# Patient Record
Sex: Female | Born: 1985 | Race: Black or African American | Hispanic: No | Marital: Single | Smoking: Former smoker
Health system: Southern US, Community
[De-identification: ages and names within clinical notes are randomized; demographics above are authoritative.]

## PROBLEM LIST (undated history)

## (undated) DIAGNOSIS — K8020 Calculus of gallbladder without cholecystitis without obstruction: Secondary | ICD-10-CM

## (undated) HISTORY — DX: Calculus of gallbladder without cholecystitis without obstruction: K80.20

## (undated) HISTORY — PX: DILATION AND CURETTAGE OF UTERUS: SHX78

---

## 2013-08-09 ENCOUNTER — Emergency Department: Payer: Self-pay | Admitting: Emergency Medicine

## 2013-08-10 LAB — BASIC METABOLIC PANEL
ANION GAP: 7 (ref 7–16)
BUN: 14 mg/dL (ref 7–18)
CALCIUM: 8.7 mg/dL (ref 8.5–10.1)
CHLORIDE: 106 mmol/L (ref 98–107)
CREATININE: 0.97 mg/dL (ref 0.60–1.30)
Co2: 22 mmol/L (ref 21–32)
EGFR (African American): >60
EGFR (Non-African Amer.): >60
Glucose: 85 mg/dL (ref 65–99)
OSMOLALITY: 270 (ref 275–301)
POTASSIUM: 3.8 mmol/L (ref 3.5–5.1)
SODIUM: 135 mmol/L — AB (ref 136–145)

## 2013-08-10 LAB — URINALYSIS, COMPLETE
Bilirubin,UR: NEGATIVE
Glucose,UR: NEGATIVE mg/dL (ref 0–75)
Ketone: NEGATIVE
Nitrite: NEGATIVE
Ph: 6 (ref 4.5–8.0)
Protein: 30
RBC,UR: 4397 /HPF (ref 0–5)
Specific Gravity: 1.021 (ref 1.003–1.030)
WBC UR: 45 /HPF (ref 0–5)

## 2013-08-10 LAB — CBC
HCT: 40.8 % (ref 35.0–47.0)
HGB: 13.3 g/dL (ref 12.0–16.0)
MCH: 26.6 pg (ref 26.0–34.0)
MCHC: 32.5 g/dL (ref 32.0–36.0)
MCV: 82 fL (ref 80–100)
Platelet: 220 10*3/uL (ref 150–440)
RBC: 4.99 10*6/uL (ref 3.80–5.20)
RDW: 14.9 % — AB (ref 11.5–14.5)
WBC: 12.5 10*3/uL — ABNORMAL HIGH (ref 3.6–11.0)

## 2013-08-10 LAB — HCG, QUANTITATIVE, PREGNANCY: Beta Hcg, Quant.: <1 m[IU]/mL — ABNORMAL LOW

## 2014-03-09 ENCOUNTER — Encounter: Payer: Self-pay | Admitting: *Deleted

## 2014-03-28 ENCOUNTER — Ambulatory Visit (INDEPENDENT_AMBULATORY_CARE_PROVIDER_SITE_OTHER): Payer: Medicaid Other | Admitting: General Surgery

## 2014-03-28 ENCOUNTER — Encounter: Payer: Self-pay | Admitting: General Surgery

## 2014-03-28 VITALS — BP 138/68 | HR 74 | Resp 12 | Ht 64.0 in | Wt 236.0 lb

## 2014-03-28 DIAGNOSIS — K219 Gastro-esophageal reflux disease without esophagitis: Secondary | ICD-10-CM

## 2014-03-28 MED ORDER — OMEPRAZOLE 40 MG PO CPDR: 40.0000 mg | DELAYED_RELEASE_CAPSULE | Freq: Every day | ORAL | Status: DC

## 2014-03-28 NOTE — Patient Instructions (Signed)
Gastroesophageal Reflux Disease, Adult Gastroesophageal reflux disease (GERD) happens when acid from your stomach flows up into the esophagus. When acid comes in contact with the esophagus, the acid causes soreness (inflammation) in the esophagus. Over time, GERD may create small holes (ulcers) in the lining of the esophagus. CAUSES   Increased body weight. This puts pressure on the stomach, making acid rise from the stomach into the esophagus.  Smoking. This increases acid production in the stomach.  Drinking alcohol. This causes decreased pressure in the lower esophageal sphincter (valve or ring of muscle between the esophagus and stomach), allowing acid from the stomach into the esophagus.  Late evening meals and a full stomach. This increases pressure and acid production in the stomach.  A malformed lower esophageal sphincter. Sometimes, no cause is found. SYMPTOMS   Burning pain in the lower part of the mid-chest behind the breastbone and in the mid-stomach area. This may occur twice a week or more often.  Trouble swallowing.  Sore throat.  Dry cough.  Asthma-like symptoms including chest tightness, shortness of breath, or wheezing. DIAGNOSIS  Your caregiver may be able to diagnose GERD based on your symptoms. In some cases, X-rays and other tests may be done to check for complications or to check the condition of your stomach and esophagus. TREATMENT  Your caregiver may recommend over-the-counter or prescription medicines to help decrease acid production. Ask your caregiver before starting or adding any new medicines.  HOME CARE INSTRUCTIONS   Change the factors that you can control. Ask your caregiver for guidance concerning weight loss, quitting smoking, and alcohol consumption.  Avoid foods and drinks that make your symptoms worse, such as:  Caffeine or alcoholic drinks.  Chocolate.  Peppermint or mint flavorings.  Garlic and onions.  Spicy foods.  Citrus fruits,  such as oranges, lemons, or limes.  Tomato-based foods such as sauce, chili, salsa, and pizza.  Fried and fatty foods.  Avoid lying down for the 3 hours prior to your bedtime or prior to taking a nap.  Eat small, frequent meals instead of large meals.  Wear loose-fitting clothing. Do not wear anything tight around your waist that causes pressure on your stomach.  Raise the head of your bed 6 to 8 inches with wood blocks to help you sleep. Extra pillows will not help.  Only take over-the-counter or prescription medicines for pain, discomfort, or fever as directed by your caregiver.  Do not take aspirin, ibuprofen, or other nonsteroidal anti-inflammatory drugs (NSAIDs). SEEK IMMEDIATE MEDICAL CARE IF:   You have pain in your arms, neck, jaw, teeth, or back.  Your pain increases or changes in intensity or duration.  You develop nausea, vomiting, or sweating (diaphoresis).  You develop shortness of breath, or you faint.  Your vomit is green, yellow, black, or looks like coffee grounds or blood.  Your stool is red, bloody, or black. These symptoms could be signs of other problems, such as heart disease, gastric bleeding, or esophageal bleeding. MAKE SURE YOU:   Understand these instructions.  Will watch your condition.  Will get help right away if you are not doing well or get worse. Document Released: 04/09/2005 Document Revised: 09/22/2011 Document Reviewed: 01/17/2011 ExitCare Patient Information 2015 [employer]. This information is not intended to replace advice given to you by your health care provider. Make sure you discuss any questions you have with your health care provider.  

## 2014-03-28 NOTE — Progress Notes (Signed)
Patient ID: Victoria Elliott, female   DOB: 1985-09-01, 28 y.o.   MRN: [id number]  Chief Complaint  Patient presents with  . Abdominal Pain    gall stones    HPI Victoria Elliott is a 28 y.o. female.  Here today for evaluation of gall stones. She had an ultrasound at [facility] office that showed gallstones. She started having reflux symptoms and that is why she ordered an ultrasound.  She complains of having burning in her throat and heartburn. It seemed to have started while she was pregnant last year, went away briefly after child birth in October 2014, but then it returned. She tried Rolaids but it did not help but she did experience significant although not complete improvement with the use of OTC Prilosec provided to her by her boyfriend's mother. It is most likely to occur in the evening hours, occasionally lasting through the night. It does not seem to be associated with any foods or activity. She is not awakened from sleep with symptoms. She does feel alcohol may make the symptoms worse.  Denies any abdominal pain. Bowels move daily and no bleeding noted.  The patient had an ultrasound completed in her primary care physician's office, but did not bring any images with her as she had been requested.  The patient reports prepregnancy weight of approximately 180.  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  HPI  Past Medical History  Diagnosis Date  . Gall stones     Past Surgical History  Procedure Laterality Date  . Cesarean section  04-22-13    No family history on file.  Social History History  Substance Use Topics  . Smoking status: Never Smoker   . Smokeless tobacco: Not on file  . Alcohol Use: Yes     Comment: 1-2 occasionally    No Known Allergies  Current Outpatient Prescriptions  Medication Sig Dispense Refill  . omeprazole (PRILOSEC) 40 MG capsule Take 1 capsule (40 mg total) by mouth daily.  60 capsule  2   No  current facility-administered medications for this visit.    Review of Systems Review of Systems  Constitutional: Negative.   Respiratory: Negative.   Cardiovascular: Negative.   Gastrointestinal: Positive for abdominal pain.    Blood pressure 138/68, pulse 74, resp. rate 12, height  (1.626 m), weight 236 lb (107.049 kg), last menstrual period 02/10/2014.  Physical Exam Physical Exam  Constitutional: She is oriented to person, place, and time. She appears well-developed and well-nourished.  Neck: Neck supple.  Cardiovascular: Normal rate, regular rhythm and normal heart sounds.   Pulmonary/Chest: Effort normal and breath sounds normal.  Abdominal: Soft. Normal appearance and bowel sounds are normal. There is no tenderness.  Lymphadenopathy:    She has no cervical adenopathy.  Neurological: She is alert and oriented to person, place, and time.  Skin: Skin is warm and dry.    Data Reviewed Laboratory studies dated 09/28/2013, within 6 months of delivery showed white blood cell count 11,900 with normal differential, hemoglobin 14.0, MCV of 80, creatinine of 0.9 with an estimated GFR 96. Normal electrolytes. Mild elevation of the alkaline phosphatase is 126 (39-117. Normal SGOT/SGPT.  PCP notes of 03/14/2014 reports an abdominal ultrasound positive for gallstones.  Assessment    Symptomatic gastroesophageal reflux aggravated by significant weight gain during/post pregnancy. No symptoms to suggest chronic cholecystitis.    Plan    The patient is young with a 50+ year life expectancy. It  is possible her gallstones may become symptomatic in the future, but I don't believe a cholecystectomy at this time lower relieve her present symptoms likely related to reflux.  The importance of weight loss to control gastroesophageal reflux has been reviewed. She's been asked to make use of a trial of Prilosec twice a day for 2 months and to report her progress.  Symptoms compatible with  biliary colic have been reviewed, and she was asked to call promptly for any of these develop.    Trial of Prilosec twice a day.  PCP: Evelene Croon Ref Franco Nones NP  Earline Mayotte 03/29/2014, 7:11 AM

## 2014-03-29 DIAGNOSIS — K219 Gastro-esophageal reflux disease without esophagitis: Secondary | ICD-10-CM | POA: Insufficient documentation

## 2014-04-04 ENCOUNTER — Telehealth: Payer: Self-pay | Admitting: *Deleted

## 2014-04-04 NOTE — Telephone Encounter (Signed)
I left message for pt to call our office to clarify her date of birth. Demographic in epic shows [birth year] and paper demographic sheet shows [birth year]

## 2014-04-04 NOTE — Telephone Encounter (Signed)
Patient called back to confirm her date of birth is [birth year]. Bonita Quin made aware.

## 2014-05-16 ENCOUNTER — Encounter: Payer: Self-pay | Admitting: General Surgery

## 2017-09-08 ENCOUNTER — Emergency Department: Payer: Managed Care, Other (non HMO)

## 2017-09-08 ENCOUNTER — Emergency Department
Admission: EM | Admit: 2017-09-08 | Discharge: 2017-09-08 | Disposition: A | Discharge status: Home / Self Care | Payer: Managed Care, Other (non HMO) | Attending: Emergency Medicine | Admitting: Emergency Medicine

## 2017-09-08 ENCOUNTER — Encounter: Payer: Self-pay | Admitting: Emergency Medicine

## 2017-09-08 DIAGNOSIS — O200 Threatened abortion: Secondary | ICD-10-CM | POA: Diagnosis not present

## 2017-09-08 DIAGNOSIS — R1084 Generalized abdominal pain: Secondary | ICD-10-CM | POA: Diagnosis not present

## 2017-09-08 DIAGNOSIS — Z3A01 Less than 8 weeks gestation of pregnancy: Secondary | ICD-10-CM | POA: Diagnosis not present

## 2017-09-08 DIAGNOSIS — O208 Other hemorrhage in early pregnancy: Secondary | ICD-10-CM

## 2017-09-08 DIAGNOSIS — O209 Hemorrhage in early pregnancy, unspecified: Secondary | ICD-10-CM | POA: Diagnosis present

## 2017-09-08 DIAGNOSIS — R109 Unspecified abdominal pain: Secondary | ICD-10-CM

## 2017-09-08 DIAGNOSIS — O26899 Other specified pregnancy related conditions, unspecified trimester: Secondary | ICD-10-CM

## 2017-09-08 LAB — COMPREHENSIVE METABOLIC PANEL
ALBUMIN: 3.6 g/dL (ref 3.5–5.0)
ALT: 15 U/L (ref 14–54)
AST: 18 U/L (ref 15–41)
Alkaline Phosphatase: 84 U/L (ref 38–126)
Anion gap: 7 (ref 5–15)
BILIRUBIN TOTAL: 0.3 mg/dL (ref 0.3–1.2)
BUN: 10 mg/dL (ref 6–20)
CO2: 24 mmol/L (ref 22–32)
Calcium: 8.4 mg/dL — ABNORMAL LOW (ref 8.9–10.3)
Chloride: 106 mmol/L (ref 101–111)
Creatinine, Ser: 0.68 mg/dL (ref 0.44–1.00)
GFR calc Af Amer: >60 mL/min (ref >60)
GFR calc non Af Amer: >60 mL/min (ref >60)
GLUCOSE: 97 mg/dL (ref 65–99)
POTASSIUM: 3.6 mmol/L (ref 3.5–5.1)
SODIUM: 137 mmol/L (ref 135–145)
TOTAL PROTEIN: 7.2 g/dL (ref 6.5–8.1)

## 2017-09-08 LAB — CBC WITH DIFFERENTIAL/PLATELET
BASOS ABS: 0 10*3/uL (ref 0–0.1)
BASOS PCT: 1 %
EOS ABS: 0.4 10*3/uL (ref 0–0.7)
Eosinophils Relative: 5 %
HCT: 39 % (ref 35.0–47.0)
HEMOGLOBIN: 12.6 g/dL (ref 12.0–16.0)
Lymphocytes Relative: 32 %
Lymphs Abs: 2.7 10*3/uL (ref 1.0–3.6)
MCH: 26.8 pg (ref 26.0–34.0)
MCHC: 32.2 g/dL (ref 32.0–36.0)
MCV: 83.1 fL (ref 80.0–100.0)
Monocytes Absolute: 0.7 10*3/uL (ref 0.2–0.9)
Monocytes Relative: 8 %
NEUTROS PCT: 56 %
Neutro Abs: 4.8 10*3/uL (ref 1.4–6.5)
Platelets: 227 10*3/uL (ref 150–440)
RBC: 4.69 MIL/uL (ref 3.80–5.20)
RDW: 13.9 % (ref 11.5–14.5)
WBC: 8.6 10*3/uL (ref 3.6–11.0)

## 2017-09-08 LAB — HCG, QUANTITATIVE, PREGNANCY: HCG, BETA CHAIN, QUANT, S: 3453 m[IU]/mL — AB (ref <5)

## 2017-09-08 LAB — ABO/RH: ABO/RH(D): O POS

## 2017-09-08 NOTE — ED Provider Notes (Signed)
[facility] Emergency Department Provider Note       Time seen: ----------------------------------------- 9:30 AM on 09/08/2017 -----------------------------------------   I have reviewed the triage vital signs and the nursing notes.  HISTORY   Chief Complaint Vaginal Bleeding    HPI Victoria Elliott is a 32 y.o. female with a history of gallstones and GERD who presents to the ED for vaginal bleeding.  Patient reports she has had vaginal bleeding for 1 week which is getting heavier.  Patient states she is around [redacted] weeks pregnant.  She is G4 P1 Ab2.  She is not having any other symptoms.  She was having abdominal cramping but this has resolved.  Past Medical History:  Diagnosis Date  . Gall stones     Patient Active Problem List   Diagnosis Date Noted  . Esophageal reflux 03/29/2014    Past Surgical History:  Procedure Laterality Date  . CESAREAN SECTION  04-22-13    Allergies Patient has no known allergies.  Social History Social History   Tobacco Use  . Smoking status: Never Smoker  . Smokeless tobacco: Never Used  Substance Use Topics  . Alcohol use: Yes    Comment: 1-2 occasionally  . Drug use: No    Review of Systems Constitutional: Negative for fever. Cardiovascular: Negative for chest pain. Respiratory: Negative for shortness of breath. Gastrointestinal: Negative for abdominal pain, vomiting and diarrhea. Genitourinary: Positive for vaginal bleeding Musculoskeletal: Negative for back pain. Skin: Negative for rash. Neurological: Negative for headaches, focal weakness or numbness.  All systems negative/normal/unremarkable except as stated in the HPI  ____________________________________________   PHYSICAL EXAM:  VITAL SIGNS: ED Triage Vitals  Enc Vitals Group     BP 09/08/17 0823 (!) 120/54     Pulse Rate 09/08/17 0823 (!) 56     Resp 09/08/17 0823 18     Temp 09/08/17 0823 98.3 F (36.8 C)     Temp Source 09/08/17  0823 Oral     SpO2 09/08/17 0823 100 %     Weight 09/08/17 0812 236 lb (107 kg)     Height --      Head Circumference --      Peak Flow --      Pain Score --      Pain Loc --      Pain Edu? --      Excl. in GC? --    Constitutional: Alert and oriented. Well appearing and in no distress. Eyes: Conjunctivae are normal. Normal extraocular movements. ENT   Head: Normocephalic and atraumatic.   Nose: No congestion/rhinnorhea.   Mouth/Throat: Mucous membranes are moist.   Neck: No stridor. Cardiovascular: Normal rate, regular rhythm. No murmurs, rubs, or gallops. Respiratory: Normal respiratory effort without tachypnea nor retractions. Breath sounds are clear and equal bilaterally. No wheezes/rales/rhonchi. Gastrointestinal: Soft and nontender. Normal bowel sounds Musculoskeletal: Nontender with normal range of motion in extremities. No lower extremity tenderness nor edema. Neurologic:  Normal speech and language. No gross focal neurologic deficits are appreciated.  Skin:  Skin is warm, dry and intact. No rash noted. Psychiatric: Mood and affect are normal. Speech and behavior are normal.  ____________________________________________  ED COURSE:  As part of my medical decision making, I reviewed the following data within the electronic MEDICAL RECORD NUMBER History obtained from family if available, nursing notes, old chart and ekg, as well as notes from prior ED visits. Patient presented for vaginal bleeding in early pregnancy, we will assess with labs and imaging  as indicated at this time.   Procedures ____________________________________________   LABS (pertinent positives/negatives)  Labs Reviewed  HCG, QUANTITATIVE, PREGNANCY - Abnormal; Notable for the following components:      Result Value   hCG, Beta Chain, Quant, S 3,453 (*)    All other components within normal limits  COMPREHENSIVE METABOLIC PANEL - Abnormal; Notable for the following components:   Calcium 8.4  (*)    All other components within normal limits  CBC WITH DIFFERENTIAL/PLATELET  URINALYSIS, COMPLETE (UACMP) WITH MICROSCOPIC  POC URINE PREG, ED  ABO/RH    RADIOLOGY  Pregnancy ultrasound IMPRESSION: Single intrauterine gestational sac, measuring 5 weeks 2 days by mean sac diameter, as above.  No yolk sac or fetal pole are visualized. Consider follow-up pelvic ultrasound in 14 days to confirm viability, as clinically warranted. ____________________________________________  DIFFERENTIAL DIAGNOSIS   Threatened miscarriage, ectopic, UTI, STI  FINAL ASSESSMENT AND PLAN  Threatened miscarriage   Plan: Patient had presented for vaginal bleeding in early pregnancy. Patient's labs did reveal an hCG of 3453.Marland Kitchen Patient's imaging revealed a pregnancy of 5 weeks and 2 days.  She has been encouraged to have follow-up ultrasound in 2 weeks.  At this point she has a threatened miscarriage but otherwise is cleared for outpatient follow-up.   Ulice Dash, MD   Note: This note was generated in part or whole with voice recognition software. Voice recognition is usually quite accurate but there are transcription errors that can and very often do occur. I apologize for any typographical errors that were not detected and corrected.     Emily Filbert, MD 09/08/17 1249

## 2017-09-08 NOTE — ED Notes (Signed)
This RN notified US Morrie Sheldon(Ashley) of the hCG reading. Pt is awaiting US at this time.

## 2017-09-08 NOTE — ED Notes (Signed)
ED Provider at bedside. 

## 2017-09-08 NOTE — ED Triage Notes (Signed)
Pt with vaginal bleeding for one week, getting heavier and states she is 8weeks preg.

## 2017-09-22 NOTE — H&P (Signed)
  Ms. Victoria Elliott is a 32 y.o. female [phone] here for Follow-up (Threatened Miscarriage)  Pt story:  Victoria Elliott started having vaginal spotting around February 19th, which was initially very light. On February 26th she started to have an increased about of bleeding, enough to wear a pad, so she went to the ED at [facility]. She was seen in the office for follow-up on February 27th, at which point her bleeding was still like. At that visit, we decided to monitor her bleeding and trend her beta hcg. Her beta hcg was rising, but not as expected in a normally developing pregnancy, so she came back today for a follow-up ultrasound. She has continued to have bleeding since her last visit, enough that she is changing pads multiple times a day.   Imaging: 09/08/17 ultrasound: -MSD 5.96mm 1954w2d -yolk sac not visualized -embryo not visualized -B/L OVs WNL  Labs: Blood type: O+ Beta quant:  -2/26: 3,453 -2/28: 4,047 -3/5: 6,213    Exam:   Constitutional:    Vitals:   09/22/17 1400  BP: 104/66  Pulse: 64   Body mass index is 38.79 kg/m.   General Appearance:    Well-developed, well-nourished, no acute distress, appears stated age  Lungs:     Respirations unlabored  Neuro: Psych:   Alert, oriented x3  Appropriate mood and insight, judgement intact   TVUS: -Ut anteverted -Single IUP seen: MSD=0.83cm (3776w3d); 852w5d by LMP -No YS or embryo seen -Cx long and closed -No FF seen in CDSs -B/L ovs appear wnl   Impression:   The encounter diagnosis was Missed abortion.   Plan:   Spontaneous miscarriage: Causes of spontaneous miscarriage discussed with patient, including prevalence, common causes, and the expectation that this event does not increase the chance that she will miscarry again in the future. Emotional support given.  Management options discussed, including: expectant, medical and surgical.  Patient has opted for surgical management.   Surgical management: We  reviewed the risks of surgery with the patient including but not limited to: bleeding which may require transfusion; infection which may require antibiotics; injury to uterus or surrounding organs; intrauterine scarring which may impair future fertility; need for additional procedures including laparotomy or laparoscopy; and other postoperative/anesthesia complications. Written informed consent was obtained. Consents signed today.   This is a scheduled same-day surgery, being scheduled for this Friday, March 15th.

## 2017-09-24 ENCOUNTER — Encounter: Payer: Self-pay | Admitting: *Deleted

## 2017-09-24 ENCOUNTER — Encounter
Admission: RE | Admit: 2017-09-24 | Discharge: 2017-09-24 | Disposition: A | Discharge status: Home / Self Care | Payer: Managed Care, Other (non HMO) | Source: Ambulatory Visit | Attending: Obstetrics and Gynecology | Admitting: Obstetrics and Gynecology

## 2017-09-24 ENCOUNTER — Other Ambulatory Visit: Payer: Self-pay

## 2017-09-24 NOTE — Patient Instructions (Signed)
Your procedure is scheduled on: 09-25-17  Report to Same Day Surgery 2nd floor medical mall [facility](Medical Mall Entrance-take elevator on left to 2nd floor.  Check in with surgery information desk.) @ 11 AM-PT NOTIFIED OF THIS VIA PHONE   Remember: Instructions that are not followed completely may result in serious medical risk, up to and including death, or upon the discretion of your surgeon and anesthesiologist your surgery may need to be rescheduled.    _x___ 1. Do not eat food after midnight the night before your procedure. You may drink clear liquids up to 2 hours before you are scheduled to arrive at the hospital for your procedure.  Do not drink clear liquids within 2 hours of your scheduled arrival to the hospital.  Clear liquids include  --Water or Apple juice without pulp  --Clear carbohydrate beverage such as ClearFast or Gatorade  --Black Coffee or Clear Tea (No milk, no creamers, do not add anything to the coffee or Tea Type 1 and type 2 diabetics should only drink water.  No gum chewing or hard candies.     __x__ 2. No Alcohol for 24 hours before or after surgery.   __x__3. No Smoking or e-cigarettes for 24 prior to surgery.  Do not use any chewable tobacco products for at least 6 hour prior to surgery   ____  4. Bring all medications with you on the day of surgery if instructed.    __x__ 5. Notify your doctor if there is any change in your medical condition     (cold, fever, infections).    x___6. On the morning of surgery brush your teeth with toothpaste and water.  You may rinse your mouth with mouth wash if you wish.  Do not swallow any toothpaste or mouthwash.   Do not wear jewelry, make-up, hairpins, clips or nail polish.  Do not wear lotions, powders, or perfumes. You may wear deodorant.  Do not shave 48 hours prior to surgery. Men may shave face and neck.  Do not bring valuables to the hospital.    [facility] is not responsible for any belongings or valuables.            Contacts, dentures or bridgework may not be worn into surgery.  Leave your suitcase in the car. After surgery it may be brought to your room.  For patients admitted to the hospital, discharge time is determined by your treatment team.  _  Patients discharged the day of surgery will not be allowed to drive home.  You will need someone to drive you home and stay with you the night of your procedure.     ____ Take anti-hypertensive listed below, cardiac, seizure, asthma, anti-reflux and psychiatric medicines. These include:  1. NONE  2.  3.  4.  5.  6.  ____Fleets enema or Magnesium Citrate as directed.   ____ Use CHG Soap or sage wipes as directed on instruction sheet   ____ Use inhalers on the day of surgery and bring to hospital day of surgery  ____ Stop Metformin and Janumet 2 days prior to surgery.    ____ Take 1/2 of usual insulin dose the night before surgery and none on the morning     surgery.   ____ Follow recommendations from Cardiologist, Pulmonologist or PCP regarding          stopping Aspirin, Coumadin, Plavix ,Eliquis, Effient, or Pradaxa, and Pletal.  X____Stop Anti-inflammatories such as Advil, Aleve, Ibuprofen, Motrin, Naproxen, Naprosyn, Goodies powders or  aspirin products NOW-OK to take Tylenol    ____ Stop supplements until after surgery.     ____ Bring C-Pap to the hospital.

## 2017-09-25 ENCOUNTER — Ambulatory Visit: Payer: Managed Care, Other (non HMO) | Admitting: Anesthesiology

## 2017-09-25 ENCOUNTER — Ambulatory Visit
Admission: RE | Admit: 2017-09-25 | Discharge: 2017-09-25 | Disposition: A | Discharge status: Home / Self Care | Payer: Managed Care, Other (non HMO) | Source: Ambulatory Visit | Attending: Obstetrics and Gynecology | Admitting: Obstetrics and Gynecology

## 2017-09-25 ENCOUNTER — Encounter: Payer: Self-pay | Admitting: *Deleted

## 2017-09-25 ENCOUNTER — Encounter
Admission: RE | Disposition: A | Discharge status: Home / Self Care | Payer: Self-pay | Source: Ambulatory Visit | Attending: Obstetrics and Gynecology

## 2017-09-25 DIAGNOSIS — F17200 Nicotine dependence, unspecified, uncomplicated: Secondary | ICD-10-CM | POA: Diagnosis not present

## 2017-09-25 DIAGNOSIS — O021 Missed abortion: Secondary | ICD-10-CM | POA: Diagnosis present

## 2017-09-25 DIAGNOSIS — K219 Gastro-esophageal reflux disease without esophagitis: Secondary | ICD-10-CM | POA: Insufficient documentation

## 2017-09-25 HISTORY — PX: DILATION AND EVACUATION: SHX1459

## 2017-09-25 LAB — CBC
HCT: 38.8 % (ref 35.0–47.0)
Hemoglobin: 12.7 g/dL (ref 12.0–16.0)
MCH: 27 pg (ref 26.0–34.0)
MCHC: 32.8 g/dL (ref 32.0–36.0)
MCV: 82.4 fL (ref 80.0–100.0)
PLATELETS: 206 10*3/uL (ref 150–440)
RBC: 4.71 MIL/uL (ref 3.80–5.20)
RDW: 13.7 % (ref 11.5–14.5)
WBC: 9.8 10*3/uL (ref 3.6–11.0)

## 2017-09-25 LAB — BASIC METABOLIC PANEL
ANION GAP: 8 (ref 5–15)
BUN: 9 mg/dL (ref 6–20)
CO2: 25 mmol/L (ref 22–32)
Calcium: 8.7 mg/dL — ABNORMAL LOW (ref 8.9–10.3)
Chloride: 104 mmol/L (ref 101–111)
Creatinine, Ser: 0.82 mg/dL (ref 0.44–1.00)
GFR calc Af Amer: >60 mL/min (ref >60)
Glucose, Bld: 94 mg/dL (ref 65–99)
POTASSIUM: 3.8 mmol/L (ref 3.5–5.1)
SODIUM: 137 mmol/L (ref 135–145)

## 2017-09-25 LAB — TYPE AND SCREEN
ABO/RH(D): O POS
Antibody Screen: NEGATIVE

## 2017-09-25 SURGERY — DILATION AND EVACUATION, UTERUS
Anesthesia: General | Wound class: Clean Contaminated

## 2017-09-25 MED ORDER — GLYCOPYRROLATE 0.2 MG/ML IJ SOLN
INTRAMUSCULAR | Status: DC | PRN
  Administered 2017-09-25: 0.1 mg via INTRAVENOUS

## 2017-09-25 MED ORDER — PROPOFOL 10 MG/ML IV BOLUS
INTRAVENOUS | Status: DC | PRN
  Administered 2017-09-25: 200 mg via INTRAVENOUS

## 2017-09-25 MED ORDER — MIDAZOLAM HCL 2 MG/2ML IJ SOLN
INTRAMUSCULAR | Status: DC | PRN
  Administered 2017-09-25: 4 mg via INTRAVENOUS

## 2017-09-25 MED ORDER — HYDROMORPHONE HCL 1 MG/ML IJ SOLN
INTRAMUSCULAR | Status: AC
  Filled 2017-09-25: qty 1

## 2017-09-25 MED ORDER — DOCUSATE SODIUM 100 MG PO CAPS: 100.0000 mg | ORAL_CAPSULE | Freq: Two times a day (BID) | ORAL | 0 refills | Status: AC

## 2017-09-25 MED ORDER — LACTATED RINGERS IV SOLN: INTRAVENOUS | Status: DC

## 2017-09-25 MED ORDER — DEXAMETHASONE SODIUM PHOSPHATE 10 MG/ML IJ SOLN
INTRAMUSCULAR | Status: DC | PRN
  Administered 2017-09-25: 10 mg via INTRAVENOUS

## 2017-09-25 MED ORDER — OXYCODONE HCL 5 MG/5ML PO SOLN: 5.0000 mg | Freq: Once | ORAL | Status: DC | PRN

## 2017-09-25 MED ORDER — LIDOCAINE HCL (CARDIAC) 20 MG/ML IV SOLN
INTRAVENOUS | Status: DC | PRN
  Administered 2017-09-25: 80 mg via INTRAVENOUS

## 2017-09-25 MED ORDER — MIDAZOLAM HCL 2 MG/2ML IJ SOLN
INTRAMUSCULAR | Status: AC
  Filled 2017-09-25: qty 4

## 2017-09-25 MED ORDER — OXYCODONE HCL 5 MG PO TABS: 5.0000 mg | ORAL_TABLET | Freq: Once | ORAL | Status: DC | PRN

## 2017-09-25 MED ORDER — PROPOFOL 10 MG/ML IV BOLUS
INTRAVENOUS | Status: AC
  Filled 2017-09-25: qty 40

## 2017-09-25 MED ORDER — KETOROLAC TROMETHAMINE 30 MG/ML IJ SOLN
INTRAMUSCULAR | Status: DC | PRN
  Administered 2017-09-25: 30 mg via INTRAVENOUS

## 2017-09-25 MED ORDER — FAMOTIDINE 20 MG PO TABS
ORAL_TABLET | ORAL | Status: AC
  Filled 2017-09-25: qty 1

## 2017-09-25 MED ORDER — EPHEDRINE SULFATE 50 MG/ML IJ SOLN
INTRAMUSCULAR | Status: DC | PRN
  Administered 2017-09-25: 10 mg via INTRAVENOUS

## 2017-09-25 MED ORDER — HYDROMORPHONE HCL 1 MG/ML IJ SOLN
INTRAMUSCULAR | Status: DC | PRN
  Administered 2017-09-25 (×2): 1 mg via INTRAVENOUS

## 2017-09-25 MED ORDER — ACETAMINOPHEN 10 MG/ML IV SOLN
INTRAVENOUS | Status: DC | PRN
  Administered 2017-09-25: 1000 mg via INTRAVENOUS

## 2017-09-25 MED ORDER — ONDANSETRON HCL 4 MG/2ML IJ SOLN
INTRAMUSCULAR | Status: DC | PRN
  Administered 2017-09-25: 4 mg via INTRAVENOUS

## 2017-09-25 MED ORDER — FENTANYL CITRATE (PF) 100 MCG/2ML IJ SOLN: 25.0000 ug | INTRAMUSCULAR | Status: DC | PRN

## 2017-09-25 MED ORDER — ACETAMINOPHEN 10 MG/ML IV SOLN
INTRAVENOUS | Status: AC
  Filled 2017-09-25: qty 100

## 2017-09-25 MED ORDER — KETOROLAC TROMETHAMINE 30 MG/ML IJ SOLN
INTRAMUSCULAR | Status: AC
  Filled 2017-09-25: qty 1

## 2017-09-25 MED ORDER — LACTATED RINGERS IV SOLN
INTRAVENOUS | Status: DC
  Administered 2017-09-25 (×2): via INTRAVENOUS

## 2017-09-25 MED ORDER — ONDANSETRON 4 MG PO TBDP: 4.0000 mg | ORAL_TABLET | Freq: Four times a day (QID) | ORAL | 0 refills | Status: DC | PRN

## 2017-09-25 MED ORDER — FAMOTIDINE 20 MG PO TABS
20.0000 mg | ORAL_TABLET | Freq: Once | ORAL | Status: AC
  Administered 2017-09-25: 20 mg via ORAL

## 2017-09-25 MED ORDER — IBUPROFEN 800 MG PO TABS: 800.0000 mg | ORAL_TABLET | Freq: Three times a day (TID) | ORAL | 1 refills | Status: DC | PRN

## 2017-09-25 SURGICAL SUPPLY — 20 items
CATH ROBINSON RED A/P 16FR (CATHETERS) ×3 IMPLANT
FILTER UTR ASPR SPEC (MISCELLANEOUS) ×1 IMPLANT
FLTR UTR ASPR SPEC (MISCELLANEOUS) ×3
GLOVE BIO SURGEON STRL SZ7 (GLOVE) ×3 IMPLANT
GLOVE INDICATOR 7.5 STRL GRN (GLOVE) ×3 IMPLANT
GOWN STRL REUS W/ TWL LRG LVL3 (GOWN DISPOSABLE) ×2 IMPLANT
GOWN STRL REUS W/TWL LRG LVL3 (GOWN DISPOSABLE) ×4
KIT BERKELEY 1ST TRIMESTER 3/8 (MISCELLANEOUS) ×3 IMPLANT
KIT TURNOVER CYSTO (KITS) ×3 IMPLANT
PACK DNC HYST (MISCELLANEOUS) ×3 IMPLANT
PAD OB MATERNITY 4.3X12.25 (PERSONAL CARE ITEMS) ×3 IMPLANT
PAD PREP 24X41 OB/GYN DISP (PERSONAL CARE ITEMS) ×3 IMPLANT
SET BERKELEY SUCTION TUBING (SUCTIONS) ×3 IMPLANT
TOWEL OR 17X26 4PK STRL BLUE (TOWEL DISPOSABLE) ×3 IMPLANT
VACURETTE 10 RIGID CVD (CANNULA) IMPLANT
VACURETTE 6 ASPIR F TIP BERK (CANNULA) IMPLANT
VACURETTE 7MM F TIP (CANNULA)
VACURETTE 7MM F TIP STRL (CANNULA) IMPLANT
VACURETTE 8 RIGID CVD (CANNULA) ×3 IMPLANT
VACURETTE 8MM F TIP (MISCELLANEOUS) ×3 IMPLANT

## 2017-09-25 NOTE — Op Note (Signed)
Operative Report Suction Dilation and Curettage   Indications: Spontaneous abortion   Pre-operative Diagnosis: Missed abortion at 6 weeks by growth, 9 weeks by LMP  Post-operative Diagnosis: same.  Procedure: 1. Suction D&C  Surgeon: Christeen DouglasBethany Lenzie Sandler, MD  Assistant(s):  None  Anesthesia: General LMA anesthesia  Anesthesiologist: Piscitello, Cleda MccreedyJoseph K, MD Anesthesiologist: Piscitello, Cleda MccreedyJoseph K, MD CRNA: Sherol DadeMacMang, Josephine H, CRNA  Estimated Blood Loss:  less than 100 mL         Intraoperative medications: toradol, acetominophen         Total IV Fluids: 1100ml  Urine Output: 75ml         Specimens: Products of conception         Complications:  None; patient tolerated the procedure well.         Disposition: PACU - hemodynamically stable.         Condition: stable  Findings: Uterus measuring 6 weeks; normal cervix, vagina, perineum.   Indication for procedure/Consents: 32 y.o. G3P1011  here for scheduled surgery for the aforementioned diagnoses.  Risks of surgery were discussed with the patient including but not limited to: bleeding which may require transfusion; infection which may require antibiotics; injury to uterus or surrounding organs; intrauterine scarring which may impair future fertility; need for additional procedures including laparotomy or laparoscopy; and other postoperative/anesthesia complications. Written informed consent was obtained.    Procedure Details:   The patient received oral antibiotics while in the preoperative area.  She was then taken to the operating room where general anesthesia was administered and was found to be adequate.  After a formal and adequate timeout was performed, she was placed in the dorsal lithotomy position and examined with the above findings. She was then prepped and draped in the sterile manner.   Her bladder was catheterized for an estimated amount of clear, yellow urine. A speculum was then placed in the patient's vagina and  a single tooth tenaculum was applied to the anterior lip of the cervix.    No uterine sounding was performed on this pregnant uterus. Her cervix was serially dilated to accommodate a 8 sized flexible suction curette.  A sharp curettage was then performed until there was a gritty texture in all four quadrants.  The tenaculum was removed from the anterior lip of the cervix and the vaginal speculum was removed after noting good hemostasis. The patient tolerated the procedure well and was taken to the recovery area awake, extubated and in stable condition.  The patient will be discharged to home as per PACU criteria.  She will receive another dose of oral antibiotics prior to discharge. Routine postoperative instructions given.  She was prescribed Percocet, Ibuprofen and Colace.  She will follow up in the clinic in two weeks for postoperative evaluation.

## 2017-09-25 NOTE — Discharge Instructions (Signed)
Dilation and Curettage or Vacuum Curettage, Care After These instructions give you information about caring for yourself after your procedure. Your doctor may also give you more specific instructions. Call your doctor if you have any problems or questions after your procedure. Follow these instructions at home: Activity    For 24 hours after your procedure, avoid driving.  Take short walks often, followed by rest periods. Ask your doctor what activities are safe for you. After one or two days, you may be able to return to your normal activities.  Do not lift anything that is heavier than 10 lb (4.5 kg) until your doctor approves.  For at least 2 weeks, or as long as told by your doctor: ? Do not douche. ? Do not use tampons. ? Do not have sex. General instructions  Take over-the-counter and prescription medicines only as told by your doctor. This is very important if you take blood thinning medicine.  Do not take baths, swim, or use a hot tub until your doctor approves. Take showers instead of baths.  Wear compression stockings as told by your doctor.  It is up to you to get the results of your procedure. Ask your doctor when your results will be ready.  Keep all follow-up visits as told by your doctor. This is important. Contact a doctor if:  You have very bad cramps that get worse or do not get better with medicine.  You have very bad pain in your belly (abdomen).  You cannot drink fluids without throwing up (vomiting).  You get pain in a different part of the area between your belly and thighs (pelvis).  You have bad-smelling discharge from your vagina.  You have a rash. Get help right away if:  You are bleeding a lot from your vagina. A lot of bleeding means soaking more than one sanitary pad in an hour, for 2 hours in a row.  You have clumps of blood (blood clots) coming from your vagina.  You have a fever or chills.  Your belly feels very tender or hard.  You  have chest pain.  You have trouble breathing.  You cough up blood.  You feel dizzy.  You feel light-headed.  You pass out (faint).  You have pain in your neck or shoulder area. Summary  Take short walks often, followed by rest periods. Ask your doctor what activities are safe for you. After one or two days, you may be able to return to your normal activities.  Do not lift anything that is heavier than 10 lb (4.5 kg) until your doctor approves.  Do not take baths, swim, or use a hot tub until your doctor approves. Take showers instead of baths.  Contact your doctor if you have any symptoms of infection, like bad-smelling discharge from your vagina. This information is not intended to replace advice given to you by your health care provider. Make sure you discuss any questions you have with your health care provider. Document Released: 04/08/2008 Document Revised: 03/17/2016 Document Reviewed: 03/17/2016 Elsevier Interactive Patient Education  2017 [employer]

## 2017-09-25 NOTE — Transfer of Care (Signed)
Immediate Anesthesia Transfer of Care Note  Patient: Victoria Elliott  Procedure(s) Performed: DILATATION AND EVACUATION (N/A )  Patient Location: PACU  Anesthesia Type:General  Level of Consciousness: awake, alert , oriented and patient cooperative  Airway & Oxygen Therapy: Patient Spontanous Breathing and Patient connected to face mask oxygen  Post-op Assessment: Report given to RN, Post -op Vital signs reviewed and stable and Patient moving all extremities X 4  Post vital signs: Reviewed and stable  Last Vitals:  Vitals:   09/25/17 1110  BP: 126/67  Pulse: 64  Resp: 20  Temp: 36.4 C  SpO2: 100%    Last Pain:  Vitals:   09/25/17 1110  TempSrc: Oral  PainSc: 2          Complications: No apparent anesthesia complications

## 2017-09-25 NOTE — Anesthesia Postprocedure Evaluation (Signed)
Anesthesia Post Note  Patient: Sport and exercise psychologisthamika Elliott  Procedure(s) Performed: DILATATION AND EVACUATION (N/A )  Patient location during evaluation: PACU Anesthesia Type: General Level of consciousness: awake and alert Pain management: pain level controlled Vital Signs Assessment: post-procedure vital signs reviewed and stable Respiratory status: spontaneous breathing, nonlabored ventilation, respiratory function stable and patient connected to nasal cannula oxygen Cardiovascular status: blood pressure returned to baseline and stable Postop Assessment: no apparent nausea or vomiting Anesthetic complications: no     Last Vitals:  Vitals:   09/25/17 1422 09/25/17 1450  BP: (!) 109/41 (!) 109/57  Pulse: (!) 50 (!) 50  Resp: 16   Temp: (!) 36.3 C   SpO2: 100% 100%    Last Pain:  Vitals:   09/25/17 1450  TempSrc:   PainSc: 0-No pain                 Cleda MccreedyJoseph K Altovise Wahler

## 2017-09-25 NOTE — Anesthesia Preprocedure Evaluation (Signed)
Anesthesia Evaluation  Patient identified by MRN, date of birth, ID band Patient awake    Reviewed: Allergy & Precautions, H&P , NPO status , Patient's Chart, lab work & pertinent test results  Airway Mallampati: III  TM Distance: >3 FB Neck ROM: full    Dental  (+) Chipped, Poor Dentition   Pulmonary neg shortness of breath, Current Smoker,           Cardiovascular Exercise Tolerance: Good (-) angina(-) DOE negative cardio ROS  (-) Valvular Problems/Murmurs     Neuro/Psych negative neurological ROS  negative psych ROS   GI/Hepatic Neg liver ROS, GERD  Medicated and Controlled,  Endo/Other  negative endocrine ROS  Renal/GU      Musculoskeletal   Abdominal   Peds  Hematology negative hematology ROS (+)   Anesthesia Other Findings Past Medical History: No date: Gall stones  Past Surgical History: 04-22-13: CESAREAN SECTION     Reproductive/Obstetrics negative OB ROS                             Anesthesia Physical Anesthesia Plan  ASA: II  Anesthesia Plan: General   Post-op Pain Management:    Induction: Intravenous  PONV Risk Score and Plan: 2 and Ondansetron, Dexamethasone and Midazolam  Airway Management Planned: LMA  Additional Equipment:   Intra-op Plan:   Post-operative Plan: Extubation in OR  Informed Consent: I have reviewed the patients History and Physical, chart, labs and discussed the procedure including the risks, benefits and alternatives for the proposed anesthesia with the patient or authorized representative who has indicated his/her understanding and acceptance.   Dental Advisory Given  Plan Discussed with: Anesthesiologist, CRNA and Surgeon  Anesthesia Plan Comments: (Patient consented for risks of anesthesia including but not limited to:  - adverse reactions to medications - damage to teeth, lips or other oral mucosa - sore throat or  hoarseness - Damage to heart, brain, lungs or loss of life  Patient voiced understanding.)        Anesthesia Quick Evaluation

## 2017-09-25 NOTE — Anesthesia Procedure Notes (Signed)
Procedure Name: LMA Insertion Date/Time: 09/25/2017 12:34 PM Performed by: Sherol DadeMacMang, Mashelle Busick H, CRNA Pre-anesthesia Checklist: Patient identified, Emergency Drugs available, Suction available, Patient being monitored and Timeout performed Patient Re-evaluated:Patient Re-evaluated prior to induction Oxygen Delivery Method: Circle system utilized Preoxygenation: Pre-oxygenation with 100% oxygen Induction Type: IV induction Ventilation: Mask ventilation without difficulty LMA: LMA inserted LMA Size: 4.0 Tube type: Oral Number of attempts: 1 Placement Confirmation: positive ETCO2,  CO2 detector and breath sounds checked- equal and bilateral Tube secured with: Tape Dental Injury: Teeth and Oropharynx as per pre-operative assessment

## 2017-09-25 NOTE — Anesthesia Post-op Follow-up Note (Signed)
Anesthesia QCDR form completed.        

## 2017-09-25 NOTE — Interval H&P Note (Signed)
History and Physical Interval Note:  09/25/2017 12:11 PM  Victoria Elliott  has presented today for surgery, with the diagnosis of incomplete spontaneous abortion  The various methods of treatment have been discussed with the patient and family. After consideration of risks, benefits and other options for treatment, the patient has consented to  Procedure(s): DILATATION AND EVACUATION (N/A) as a surgical intervention .  The patient's history has been reviewed, patient examined, no change in status, stable for surgery.  I have reviewed the patient's chart and labs.  Questions were answered to the patient's satisfaction.     Christeen DouglasBethany Granger Chui

## 2017-09-28 LAB — SURGICAL PATHOLOGY

## 2018-09-29 ENCOUNTER — Other Ambulatory Visit: Payer: Self-pay

## 2018-09-29 ENCOUNTER — Emergency Department
Admission: EM | Admit: 2018-09-29 | Discharge: 2018-09-29 | Disposition: A | Discharge status: Home / Self Care | Payer: Managed Care, Other (non HMO) | Attending: Emergency Medicine | Admitting: Emergency Medicine

## 2018-09-29 ENCOUNTER — Encounter: Payer: Self-pay | Admitting: Emergency Medicine

## 2018-09-29 DIAGNOSIS — Z7689 Persons encountering health services in other specified circumstances: Secondary | ICD-10-CM | POA: Insufficient documentation

## 2018-09-29 NOTE — ED Triage Notes (Signed)
PT arrives from work stating her work sent her home for 14 days or until she received a work note clearing her to go back to work. Pt states her work checks their temperature prior to clocking in and hers was 100.2. In triage pt's oral temp 98.4. No travel reported; no known exposure. Pt denies symptoms.

## 2018-09-29 NOTE — ED Provider Notes (Signed)
[facility] Emergency Department Provider Note ____________________________________________  Time seen: 1407  I have reviewed the triage vital signs and the nursing notes.  HISTORY  Chief Complaint  Work Note  HPI Victoria Elliott is a 33 y.o. female presents herself to the ED from home, for request of evaluation and work note.  Patient works at a local [employer], where they have been doing daily temp checks of the employees.  Patient describes she was found to have an oral temperature of 100.2 F upon reporting to work for her second shift job.  Patient reports her last oral intake was about 4 hours prior to arrival.  She has not had any coffee, tea, or anything to eat or drink within an hour or for temperature check.  According to the patient, her company has adopted a policy suggesting the any patient with a temperature >99.5 F.  The patient denies any cough, fever, travel (to a high risk area), contact with anyone who has traveled, or anyone was tested positive for COIVD-19.  She is here at employer's request for screening and return to work evaluation.  Past Medical History:  Diagnosis Date  . Gall stones     Patient Active Problem List   Diagnosis Date Noted  . Esophageal reflux 03/29/2014    Past Surgical History:  Procedure Laterality Date  . CESAREAN SECTION  04-22-13  . DILATION AND EVACUATION N/A 09/25/2017   Procedure: DILATATION AND EVACUATION;  Surgeon: Christeen Douglas, MD;  Location: [facility] ORS;  Service: Gynecology;  Laterality: N/A;    Prior to Admission medications   Medication Sig Start Date End Date Taking? Authorizing Provider  ibuprofen (ADVIL,MOTRIN) 800 MG tablet Take 1 tablet (800 mg total) by mouth every 8 (eight) hours as needed for moderate pain or cramping. 09/25/17   Christeen Douglas, MD  ondansetron (ZOFRAN ODT) 4 MG disintegrating tablet Take 1 tablet (4 mg total) by mouth every 6 (six) hours as needed for nausea.  09/25/17   Christeen Douglas, MD    Allergies Patient has no known allergies.  History reviewed. No pertinent family history.  Social History Social History   Tobacco Use  . Smoking status: Current Some Day Smoker    Packs/day: 0.50    Years: 10.00    Pack years: 5.00    Types: Cigarettes  . Smokeless tobacco: Never Used  Substance Use Topics  . Alcohol use: Yes    Comment: 1-2 occasionally  . Drug use: No    Review of Systems  Constitutional: Negative for fever. Eyes: Negative for visual changes. ENT: Negative for sore throat. Cardiovascular: Negative for chest pain. Respiratory: Negative for shortness of breath. Gastrointestinal: Negative for abdominal pain, vomiting and diarrhea. Genitourinary: Negative for dysuria. Musculoskeletal: Negative for back pain. Skin: Negative for rash. Neurological: Negative for headaches, focal weakness or numbness. ____________________________________________  PHYSICAL EXAM:  VITAL SIGNS: ED Triage Vitals [09/29/18 1317]  Enc Vitals Group     BP (!) 121/91     Pulse Rate 67     Resp 16     Temp 98.4 F (36.9 C)     Temp Source Oral     SpO2 100 %     Weight 220 lb (99.8 kg)     Height 5\' 4"  (1.626 m)     Head Circumference      Peak Flow      Pain Score 0     Pain Loc      Pain Edu?  Excl. in GC?     Constitutional: Alert and oriented. Well appearing and in no distress. Head: Normocephalic and atraumatic. Eyes: Conjunctivae are normal. PERRL. Normal extraocular movements Ears: Canals clear. TMs intact bilaterally. Nose: No congestion/rhinorrhea/epistaxis. Mouth/Throat: Mucous membranes are moist. Neck: Supple. No thyromegaly. Hematological/Lymphatic/Immunological: No cervical lymphadenopathy. Cardiovascular: Normal rate, regular rhythm. Normal distal pulses. Respiratory: Normal respiratory effort. No wheezes/rales/rhonchi. Musculoskeletal: Nontender with normal range of motion in all extremities.  Neurologic:   Normal gait without ataxia. Normal speech and language. No gross focal neurologic deficits are appreciated. Skin:  Skin is warm, dry and intact. No rash noted. Psychiatric: Mood and affect are normal. Patient exhibits appropriate insight and judgment. ____________________________________________  PROCEDURES  Procedures ____________________________________________  INITIAL IMPRESSION / ASSESSMENT AND PLAN / ED COURSE  Patient with a benign exam, afebrile, and without any high risk criteria for testing, presents at her employer's request.  We discussed that without criteria for testing, the recommendation is a 2-week quarantine with daily temp checks.  Patient apparently has family in the interim that that is also her company's current policy.  Patient is given instruction on monitoring of her symptoms, and possible screening should symptoms develop.  She is discharged with a work note releasing her to work tomorrow if there is no requirement for quarantine versus a return to work on 10-14-18, if a two-week quarantine is in fact implemented.  Patient verbalized understanding is discharged to follow with primary provider as discussed. ____________________________________________  FINAL CLINICAL IMPRESSION(S) / ED DIAGNOSES  Final diagnoses:  Return to work evaluation      Karmen Stabs, Charlesetta Ivory, PA-C 09/29/18 1527    Don Perking, [place], MD 10/03/18 1134

## 2018-09-29 NOTE — ED Notes (Signed)
Pt states is employee at [employer], states at work today had oral temp of 100.2, was sent home for 2 weeks or until medically cleared to come back to work. Pt denies symptoms at this time, is afebrile on arrival to ED. Pt is alert and oriented, NAD noted at this time.

## 2018-09-29 NOTE — Discharge Instructions (Addendum)
Your exam is normal, you are without fevers or symptoms on presentation. You DO NOT meet criteria for high-risk exposure to, or contact with a high-risk individual. These health system guidelines allow [place] to triage and manage anyone who presents with concern of exposure. 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 We suggest twice daily temperature checks. Continue to monitor for any symptoms. Call your primary care provider for screening instruction. Return to the ED only if (severe) respiratory symptoms and/or fevers develop.

## 2019-05-11 ENCOUNTER — Emergency Department
Admission: EM | Admit: 2019-05-11 | Discharge: 2019-05-11 | Disposition: A | Discharge status: Home / Self Care | Payer: Medicaid Other | Attending: Emergency Medicine | Admitting: Emergency Medicine

## 2019-05-11 ENCOUNTER — Other Ambulatory Visit: Payer: Self-pay

## 2019-05-11 ENCOUNTER — Emergency Department: Payer: Medicaid Other

## 2019-05-11 DIAGNOSIS — Z20828 Contact with and (suspected) exposure to other viral communicable diseases: Secondary | ICD-10-CM | POA: Diagnosis not present

## 2019-05-11 DIAGNOSIS — R05 Cough: Secondary | ICD-10-CM | POA: Diagnosis present

## 2019-05-11 DIAGNOSIS — J069 Acute upper respiratory infection, unspecified: Secondary | ICD-10-CM | POA: Diagnosis not present

## 2019-05-11 MED ORDER — ONDANSETRON 4 MG PO TBDP: 4.0000 mg | ORAL_TABLET | Freq: Three times a day (TID) | ORAL | 0 refills | Status: DC | PRN

## 2019-05-11 MED ORDER — PREDNISONE 10 MG PO TABS: ORAL_TABLET | ORAL | 0 refills | Status: DC

## 2019-05-11 MED ORDER — ONDANSETRON 4 MG PO TBDP
4.0000 mg | ORAL_TABLET | Freq: Once | ORAL | Status: AC
  Administered 2019-05-11: 4 mg via ORAL
  Filled 2019-05-11: qty 1

## 2019-05-11 NOTE — Discharge Instructions (Signed)
Follow-up with your primary care provider or return to the emergency department if any severe worsening of your symptoms or difficulty breathing.  A prescription for both Zofran and prednisone was sent to your pharmacy.  Increase fluids.  You may also take Tylenol with the prednisone if additional pain medication is needed or your fever is elevated.  Discontinue smoking.  You will need to quarantine until you have heard the results of your Covid test.

## 2019-05-11 NOTE — ED Provider Notes (Signed)
[facility] Emergency Department Provider Note  ___________________________________________   First MD Initiated Contact with Patient 05/11/19 1326     (approximate)  I have reviewed the triage vital signs and the nursing notes.   HISTORY  Chief Complaint Sore Throat and Cough   HPI Victoria Elliott is a 33 y.o. female presents to the ED with complaint of sore throat, cough, decreased appetite and subjective fever and chills.  He also reports nausea and diarrhea.  Patient states that she recently traveled to [place].  She is unaware of any specific positive contacts to Covid.  Patient states that her symptoms were sudden.  She denies any other family members being sick.  Patient rates her pain as a 6 out of 10.       Past Medical History:  Diagnosis Date  . Gall stones     Patient Active Problem List   Diagnosis Date Noted  . Esophageal reflux 03/29/2014    Past Surgical History:  Procedure Laterality Date  . CESAREAN SECTION  04-22-13  . DILATION AND EVACUATION N/A 09/25/2017   Procedure: DILATATION AND EVACUATION;  Surgeon: Christeen Douglas, MD;  Location: [facility] ORS;  Service: Gynecology;  Laterality: N/A;    Prior to Admission medications   Medication Sig Start Date End Date Taking? Authorizing Provider  ondansetron (ZOFRAN ODT) 4 MG disintegrating tablet Take 1 tablet (4 mg total) by mouth every 8 (eight) hours as needed for nausea or vomiting. 05/11/19   Tommi Rumps, PA-C  predniSONE (DELTASONE) 10 MG tablet Take 6 tablets  today, on day 2 take 5 tablets, day 3 take 4 tablets, day 4 take 3 tablets, day 5 take  2 tablets and 1 tablet the last day 05/11/19   Tommi Rumps, PA-C    Allergies Patient has no known allergies.  No family history on file.  Social History Social History   Tobacco Use  . Smoking status: Current Some Day Smoker    Packs/day: 0.50    Years: 10.00    Pack years: 5.00    Types: Cigarettes  . Smokeless  tobacco: Never Used  Substance Use Topics  . Alcohol use: Yes    Comment: 1-2 occasionally  . Drug use: No    Review of Systems Constitutional: Objective fever/chills Eyes: No visual changes. ENT: Positive sore throat. Cardiovascular: Denies chest pain. Respiratory: Denies shortness of breath.  Positive cough. Gastrointestinal: No abdominal pain.  Positive nausea, no vomiting.  Positive diarrhea. Genitourinary: Negative for dysuria. Musculoskeletal: Positive for muscle aches. Skin: Negative for rash. Neurological: Negative for headaches, focal weakness or numbness. ____________________________________________   PHYSICAL EXAM:  VITAL SIGNS: ED Triage Vitals [05/11/19 1317]  Enc Vitals Group     BP 137/79     Pulse Rate 88     Resp 16     Temp 99.3 F (37.4 C)     Temp Source Oral     SpO2 100 %     Weight 222 lb (100.7 kg)     Height 5\' 4"  (1.626 m)     Head Circumference      Peak Flow      Pain Score 6     Pain Loc      Pain Edu?      Excl. in GC?    Constitutional: Alert and oriented. Well appearing and in no acute distress. Eyes: Conjunctivae are normal. PERRL. EOMI. Head: Atraumatic. Nose: No congestion/rhinnorhea. Mouth/Throat: Mucous membranes are moist.  Oropharynx non-erythematous.  Uvula is midline.  No exudate was noted. Neck: No stridor.   Cardiovascular: Normal rate, regular rhythm. Grossly normal heart sounds.  Good peripheral circulation. Respiratory: Normal respiratory effort.  No retractions. Lungs mild expiratory wheeze heard left upper lung.  Patient has a frequent cough. Gastrointestinal: Soft and nontender. No distention.  Musculoskeletal: Moves upper and lower extremities that any difficulty.  Normal gait was noted. Neurologic:  Normal speech and language. No gross focal neurologic deficits are appreciated. No gait instability. Skin:  Skin is warm, dry and intact. No rash noted. Psychiatric: Mood and affect are normal. Speech and behavior are  normal.  ____________________________________________   LABS (all labs ordered are listed, but only abnormal results are displayed)  Labs Reviewed  SARS CORONAVIRUS 2 (TAT 6-24 HRS)   RADIOLOGY   Official radiology report(s): Dg Chest Portable 1 View  Result Date: 05/11/2019 CLINICAL DATA:  Cough and fever for 2 days, recent travel EXAM: PORTABLE CHEST 1 VIEW COMPARISON:  None. FINDINGS: Normal heart size. Normal mediastinal contour. No pneumothorax. No pleural effusion. Lungs appear clear, with no acute consolidative airspace disease and no pulmonary edema. IMPRESSION: No active disease. Electronically Signed   By: Delbert PhenixJason A Poff M.D.   On: 05/11/2019 14:46    ____________________________________________   PROCEDURES  Procedure(s) performed (including Critical Care):  Procedures ____________________________________________   INITIAL IMPRESSION / ASSESSMENT AND PLAN / ED COURSE  As part of my medical decision making, I reviewed the following data within the electronic MEDICAL RECORD NUMBER Notes from prior ED visits and [place] Controlled Substance Database   Victoria Elliott was evaluated in Emergency Department on 05/11/2019 for the symptoms described in the history of present illness. She was evaluated in the context of the global COVID-19 pandemic, which necessitated consideration that the patient might be at risk for infection with the SARS-CoV-2 virus that causes COVID-19. Institutional protocols and algorithms that pertain to the evaluation of patients at risk for COVID-19 are in a state of rapid change based on information released by regulatory bodies including the CDC and federal and state organizations. These policies and algorithms were followed during the patient's care in the ED.  33 year old female presents to the ED with complaint of sore throat, cough, chills for 2 days after returning from a trip to [place].  Patient has continued to smoke during this time.  She denies any  other family member sick with similar symptoms.  She complains of some mild nausea and diarrhea.  Zofran was given while in the ED and patient was able to eat some crackers.  Chest x-ray was negative for acute cardiopulmonary disease.  A Covid test was done and patient is aware that she will receive the information in 1 to 2 days.  She is to quarantine until she has received the results.  She was given a note to remain out of work during that time and also an additional 10 days if she is positive.  She was encouraged to return to the emergency department if any severe worsening of her symptoms or difficulty breathing.  She was given a prescription for prednisone and also Zofran.  ____________________________________________   FINAL CLINICAL IMPRESSION(S) / ED DIAGNOSES  Final diagnoses:  Viral URI with cough     ED Discharge Orders         Ordered    predniSONE (DELTASONE) 10 MG tablet     05/11/19 1500    ondansetron (ZOFRAN ODT) 4 MG disintegrating tablet  Every 8 hours PRN  05/11/19 1500           Note:  This document was prepared using Dragon voice recognition software and may include unintentional dictation errors.    Johnn Hai, PA-C 05/11/19 1511    Carrie Mew, MD 05/13/19 2045

## 2019-05-11 NOTE — ED Notes (Signed)
esign not working at this time. Pt verbalized discharge instructions and has no questions at this time.  

## 2019-05-11 NOTE — ED Notes (Signed)
Pt given saltines ? ?

## 2019-05-11 NOTE — ED Triage Notes (Signed)
Says she has covid symptoms.

## 2019-05-11 NOTE — ED Triage Notes (Signed)
Sore throat and cough X 2 days, states she recently traveled for [place]. No known positive contacts. Pt alert and oriented X4, cooperative, RR even and unlabored, color WNL. Pt in NAD.

## 2019-05-12 LAB — SARS CORONAVIRUS 2 (TAT 6-24 HRS): SARS Coronavirus 2: NEGATIVE

## 2020-05-03 DIAGNOSIS — O09893 Supervision of other high risk pregnancies, third trimester: Secondary | ICD-10-CM

## 2020-08-29 ENCOUNTER — Other Ambulatory Visit: Payer: Self-pay

## 2020-08-29 ENCOUNTER — Observation Stay
Admission: EM | Admit: 2020-08-29 | Discharge: 2020-08-29 | Disposition: A | Discharge status: Home / Self Care | Payer: 59 | Attending: Certified Nurse Midwife | Admitting: Certified Nurse Midwife

## 2020-08-29 DIAGNOSIS — K2970 Gastritis, unspecified, without bleeding: Secondary | ICD-10-CM | POA: Diagnosis not present

## 2020-08-29 DIAGNOSIS — O99612 Diseases of the digestive system complicating pregnancy, second trimester: Secondary | ICD-10-CM | POA: Diagnosis present

## 2020-08-29 DIAGNOSIS — Z3A23 23 weeks gestation of pregnancy: Secondary | ICD-10-CM | POA: Diagnosis not present

## 2020-08-29 DIAGNOSIS — Z8616 Personal history of COVID-19: Secondary | ICD-10-CM | POA: Diagnosis not present

## 2020-08-29 MED ORDER — CALCIUM CARBONATE ANTACID 500 MG PO CHEW
400.0000 mg | CHEWABLE_TABLET | Freq: Once | ORAL | Status: AC
  Administered 2020-08-29: 400 mg via ORAL
  Filled 2020-08-29: qty 2

## 2020-08-29 MED ORDER — ONDANSETRON 4 MG PO TBDP
4.0000 mg | ORAL_TABLET | Freq: Once | ORAL | Status: AC
  Administered 2020-08-29: 4 mg via ORAL
  Filled 2020-08-29: qty 1

## 2020-08-29 MED ORDER — LACTATED RINGERS IV BOLUS
1000.0000 mL | Freq: Once | INTRAVENOUS | Status: AC
  Administered 2020-08-29: 1000 mL via INTRAVENOUS

## 2020-08-29 MED ORDER — FAMOTIDINE 20 MG PO TABS
40.0000 mg | ORAL_TABLET | Freq: Once | ORAL | Status: AC
  Administered 2020-08-29: 40 mg via ORAL
  Filled 2020-08-29: qty 2

## 2020-08-29 MED ORDER — ACETAMINOPHEN 325 MG PO TABS
650.0000 mg | ORAL_TABLET | Freq: Four times a day (QID) | ORAL | Status: DC | PRN
  Administered 2020-08-29: 650 mg via ORAL
  Filled 2020-08-29: qty 2

## 2020-08-29 MED ORDER — PANTOPRAZOLE SODIUM 40 MG PO TBEC: 40.0000 mg | DELAYED_RELEASE_TABLET | Freq: Every day | ORAL | 5 refills | Status: DC

## 2020-08-29 MED ORDER — LACTATED RINGERS IV SOLN: INTRAVENOUS | Status: DC

## 2020-08-29 MED ORDER — ONDANSETRON 4 MG PO TBDP: 4.0000 mg | ORAL_TABLET | Freq: Three times a day (TID) | ORAL | 0 refills | Status: DC | PRN

## 2020-08-29 NOTE — Progress Notes (Signed)
RN and CNM at bedside and reviewed discharge instructions, medications and follow up care with patient. RN reviewed BRAT diet with patient and when to return for treatment. Patient verbalized understanding and all questions answered.

## 2020-08-29 NOTE — Discharge Summary (Signed)
Patient ID: Claude Swendsen MRN: [id number] DOB/AGE: [birth year] 35 y.o.  Admit date: 08/29/2020 Discharge date: 08/29/2020  Admission Diagnoses: 34yo, G3P0, at [redacted]w[redacted]d with N/V - starting yesterday and Diarrhea - starting last night and mild HA.  Covid infection in Jan 2022,  vaccinated x3.  Factors complicating this pregnancy  1. Marginal cord insertion 2. Obesity in pregnancy - BMI 41 3. History of LTCS 4. Prediabetes  5. H/o mental health diagnoses  Discharge Diagnoses: Gastritis  Prenatal Procedures: none  Consults: None  Significant Diagnostic Studies:  No results found for this or any previous visit (from the past 168 hour(s)).  Treatments: IV hydration and antiemetic   Hospital Course:  This is a 35 y.o. G3P0010 with IUP at [redacted]w[redacted]d admitted for GI infection symptoms.  Doppler 150bpm Zofran ODT 4mg  @ 1209 Tylenol 650mg  @ 1445 Pepcid 40mg  @ 1640 TUMS @ 1639 LR bolus 1L  She was observed, she had no signs/symptoms of maternal-fetal concerns.    She was deemed stable for discharge to home with outpatient follow up.  Discharge Physical Exam:  BP (!) 114/49 (BP Location: Left Arm)   Pulse 72   Temp 98.3 F (36.8 C) (Oral)   Resp 16   Ht 5\' 4"  (1.626 m)   Wt 111.1 kg   BMI 42.05 kg/m   General: NAD CV: RRR Pulm: CTABL, nl effort ABD: s/nd/nt, gravid DVT Evaluation: LE non-ttp, no evidence of DVT on exam.   TOCO: quiet SVE: deferred      Discharge Condition: Stable  Disposition: Discharge disposition: 01-Home or Self Care        Allergies as of 08/29/2020   No Known Allergies     Medication List    STOP taking these medications   predniSONE 10 MG tablet Commonly known as: DELTASONE     TAKE these medications   multivitamin-prenatal 27-0.8 MG Tabs tablet Take 1 tablet by mouth daily at 12 noon.   ondansetron 4 MG disintegrating tablet Commonly known as: Zofran ODT Take 1 tablet (4 mg total) by mouth every 8 (eight) hours as needed for  nausea or vomiting.   pantoprazole 40 MG tablet Commonly known as: Protonix Take 1 tablet (40 mg total) by mouth daily.       Follow-up Information    [facility]. Go on 09/10/2020.   Why: for your already scheduled appointment Contact information: [address] [place] 08/31/2020 [facility] 09/12/2020              Signed:  [place], [place] 08/29/2020 6:18 PM

## 2020-08-29 NOTE — OB Triage Note (Signed)
Patient G4P1 [redacted]w[redacted]d presents to L&D with complaints of nausea, vomiting, diarrhea and a mild headache. Patient reports having nausea/vomiting since yesterday around 1300. She reports having diarrhea since last night and has had 3 occurrences of diarrhea. Patient reports abdominal cramping after getting sick but denies cramping currently. Patient reports mild headache but states she does not need Tylenol for it. Patient put on contact/airborne Covid precautions due to symptoms and plan to test patient for Covid. After performing Covid swab, patient told RN she tested positive for Covid in January. Patient showed RN results to Covid from [facility] and results showed positive on 07/23/20. RN reported information to [place], [place], from infection prevention and decision was made to not send Covid test and discontinue Covid precautions. Patient aware and RN discussed plan of care.

## 2020-08-29 NOTE — Discharge Instructions (Signed)
Bland Diet A bland diet consists of foods that are often soft and do not have a lot of fat, fiber, or extra seasonings. Foods without fat, fiber, or seasoning are easier for the body to digest. They are also less likely to irritate your mouth, throat, stomach, and other parts of your digestive system. A bland diet is sometimes called a BRAT diet. What is my plan? Your health care provider or food and nutrition specialist (dietitian) may recommend specific changes to your diet to prevent symptoms or to treat your symptoms. These changes may include:  Eating small meals often.  Cooking food until it is soft enough to chew easily.  Chewing your food well.  Drinking fluids slowly.  Not eating foods that are very spicy, sour, or fatty.  Not eating citrus fruits, such as oranges and grapefruit. What do I need to know about this diet?  Eat a variety of foods from the bland diet food list.  Do not follow a bland diet longer than needed.  Ask your health care provider whether you should take vitamins or supplements. What foods can I eat? Grains Hot cereals, such as cream of wheat. Rice. Bread, crackers, or tortillas made from refined white flour.   Vegetables Canned or cooked vegetables. Mashed or boiled potatoes. Fruits Bananas. Applesauce. Other types of cooked or canned fruit with the skin and seeds removed, such as canned peaches or pears.   Meats and other proteins Scrambled eggs. Creamy peanut butter or other nut butters. Lean, well-cooked meats, such as chicken or fish. Tofu. Soups or broths.   Dairy Low-fat dairy products, such as milk, cottage cheese, or yogurt. Beverages Water. Herbal tea. Apple juice.   Fats and oils Mild salad dressings. Canola or olive oil. Sweets and desserts Pudding. Custard. Fruit gelatin. Ice cream. The items listed above may not be a complete list of recommended foods and beverages. Contact a dietitian for more options. What foods are not  recommended? Grains Whole grain breads and cereals. Vegetables Raw vegetables. Fruits Raw fruits, especially citrus, berries, or dried fruits. Dairy Whole fat dairy foods. Beverages Caffeinated drinks. Alcohol. Seasonings and condiments Strongly flavored seasonings or condiments. Hot sauce. Salsa. Other foods Spicy foods. Fried foods. Sour foods, such as pickled or fermented foods. Foods with high sugar content. Foods high in fiber. The items listed above may not be a complete list of foods and beverages to avoid. Contact a dietitian for more information. Summary  A bland diet consists of foods that are often soft and do not have a lot of fat, fiber, or extra seasonings.  Foods without fat, fiber, or seasoning are easier for the body to digest.  Check with your health care provider to see how long you should follow this diet plan. It is not meant to be followed for long periods. This information is not intended to replace advice given to you by your health care provider. Make sure you discuss any questions you have with your health care provider. Document Revised: 07/29/2017 Document Reviewed: 07/29/2017 Elsevier Patient Education  2021 Elsevier Inc.   Gastritis, Adult Gastritis is inflammation of the stomach. There are two kinds of gastritis:  Acute gastritis. This kind develops suddenly.  Chronic gastritis. This kind is much more common and lasts for a long time. Gastritis happens when the lining of the stomach becomes weak or gets damaged. Without treatment, gastritis can lead to stomach bleeding and ulcers. What are the causes? This condition may be caused by:  An  infection.  Drinking too much alcohol.  Certain medicines. These include steroids, antibiotics, and some over-the-counter medicines, such as aspirin or ibuprofen.  Having too much acid in the stomach.  A disease of the intestines or stomach.  Stress.  An allergic reaction.  Crohn's disease.  Some  cancer treatments (radiation). Sometimes the cause of this condition is not known. What are the signs or symptoms? Symptoms of this condition include:  Pain or a burning sensation in the upper abdomen.  Nausea.  Vomiting.  An uncomfortable feeling of fullness after eating.  Weight loss.  Bad breath.  Blood in your vomit or stools. In some cases, there are no symptoms. How is this diagnosed? This condition may be diagnosed with:  Your medical history and a description of your symptoms.  A physical exam.  Tests. These can include: ? Blood tests. ? Stool tests. ? A test in which a thin, flexible instrument with a light and a camera is passed down the esophagus and into the stomach (upper endoscopy). ? A test in which a sample of tissue is taken for testing (biopsy). How is this treated? This condition may be treated with medicines. The medicines that are used vary depending on the cause of the gastritis:  If the condition is caused by a bacterial infection, you may be given antibiotic medicines.  If the condition is caused by too much acid in the stomach, you may be given medicines called H2 blockers, proton pump inhibitors, or antacids. Treatment may also involve stopping the use of certain medicines, such as aspirin, ibuprofen, or other NSAIDs. Follow these instructions at home: Medicines  Take over-the-counter and prescription medicines only as told by your health care provider.  If you were prescribed an antibiotic medicine, take it as told by your health care provider. Do not stop taking the antibiotic even if you start to feel better. Eating and drinking  Eat small, frequent meals instead of large meals.  Avoid foods and drinks that make your symptoms worse.  Drink enough fluid to keep your urine pale yellow.   Alcohol use  Do not drink alcohol if: ? Your health care provider tells you not to drink. ? You are pregnant, may be pregnant, or are planning to  become pregnant.  If you drink alcohol: ? Limit your use to:  0-1 drink a day for women.  0-2 drinks a day for men. ? Be aware of how much alcohol is in your drink. In the [place] one drink equals one 12 oz bottle of beer (355 mL), one 5 oz glass of wine (148 mL), or one 1 oz glass of hard liquor (44 mL). General instructions  Talk with your health care provider about ways to manage stress, such as getting regular exercise or practicing deep breathing, meditation, or yoga.  Do not use any products that contain nicotine or tobacco, such as cigarettes and e-cigarettes. If you need help quitting, ask your health care provider.  Keep all follow-up visits as told by your health care provider. This is important. Contact a health care provider if:  Your symptoms get worse.  Your symptoms return after treatment. Get help right away if:  You vomit blood or material that looks like coffee grounds.  You have black or dark red stools.  You are unable to keep fluids down.  Your abdominal pain gets worse.  You have a fever.  You do not feel better after one week. Summary  Gastritis is inflammation of the lining  of the stomach that can occur suddenly (acute) or develop slowly over time (chronic).  This condition is diagnosed with a medical history, a physical exam, or tests.  This condition may be treated with medicines to treat infection or medicines to reduce the amount of acid in your stomach.  Follow your health care provider's instructions about taking medicines, making changes to your diet, and knowing when to call for help. This information is not intended to replace advice given to you by your health care provider. Make sure you discuss any questions you have with your health care provider. Document Revised: 11/17/2017 Document Reviewed: 11/17/2017 Elsevier Patient Education  2021 [employer].

## 2020-12-04 ENCOUNTER — Other Ambulatory Visit: Payer: Self-pay | Admitting: Obstetrics and Gynecology

## 2020-12-04 NOTE — H&P (Deleted)
  The note originally documented on this encounter has been moved the the encounter in which it belongs.  

## 2020-12-04 NOTE — H&P (Addendum)
Victoria Elliott is a 35 y.o. female presenting for elective repeat LTCS and BTL on 12/19/20 . EDC 6/12 based on LMP an confirmatory 6 week u/s OB History    Gravida  4   Para  1   Term  1   Preterm      AB  2   Living  1     SAB  2   IAB      Ectopic      Multiple      Live Births  1        Obstetric Comments  1st Menstrual Cycle:  12 1st Pregnancy:  26       Past Medical History:  Diagnosis Date  . Gall stones    Past Surgical History:  Procedure Laterality Date  . CESAREAN SECTION  04-22-13  . DILATION AND EVACUATION N/A 09/25/2017   Procedure: DILATATION AND EVACUATION;  Surgeon: Christeen Douglas, MD;  Location: [facility] ORS;  Service: Gynecology;  Laterality: N/A;   Family History: family history is not on file. Social History:  reports that she quit smoking about 7 months ago. Her smoking use included cigarettes. She has a 5.00 pack-year smoking history. She has never used smokeless tobacco. She reports current alcohol use. She reports that she does not use drugs.     Maternal Diabetes: No Genetic Screening: Normal Maternal Ultrasounds/Referrals: Normal Fetal Ultrasounds or other Referrals:  None Maternal Substance Abuse:  No Significant Maternal Medications:  Meds include: Zoloft Significant Maternal Lab Results:  Group B Strep positive, hct 30 Other Comments:  None  Review of Systems Review of Systems: A full review of systems was performed and negative except as noted in the HPI.   Eyes: no vision change  Ears: left ear pain  Oropharynx: no sore throat  Pulmonary . No shortness of breath , no hemoptysis Cardiovascular: no chest pain , no irregular heart beat  Gastrointestinal:no blood in stool . No diarrhea, no constipation Uro gynecologic: no dysuria , no pelvic pain Neurologic : no seizure , no migraines    Musculoskeletal: no muscular weakness History   unknown if currently breastfeeding. Exam Physical Exam  BP 125/74 Lungs CTA   CV RRR   Abd: gravid  Prenatal labs: ABO, Rh:  O+ Antibody:  neg Rubella:  Imm / Varicella Imm RPR:   nr HBsAg:   neg HIV:   neg GBS:   Positive  Assessment/Plan: Elective repeat LTCS . Reconfirms desire for sterilization  Risks discussed with pt . See [facility] notes  Failure from BTL 1:300/ yr   Suzy Bouchard 12/04/2020, 5:59 PM

## 2020-12-05 ENCOUNTER — Ambulatory Visit
Admission: RE | Admit: 2020-12-05 | Discharge: 2020-12-05 | Disposition: A | Discharge status: Home / Self Care | Payer: Medicaid Other | Source: Ambulatory Visit | Attending: Obstetrics and Gynecology | Admitting: Obstetrics and Gynecology

## 2020-12-05 ENCOUNTER — Observation Stay
Admission: EM | Admit: 2020-12-05 | Discharge: 2020-12-05 | Disposition: A | Discharge status: Home / Self Care | Payer: Medicaid Other | Attending: Certified Nurse Midwife | Admitting: Certified Nurse Midwife

## 2020-12-05 ENCOUNTER — Encounter: Payer: Self-pay | Admitting: Obstetrics and Gynecology

## 2020-12-05 ENCOUNTER — Other Ambulatory Visit: Payer: Self-pay

## 2020-12-05 DIAGNOSIS — O99213 Obesity complicating pregnancy, third trimester: Secondary | ICD-10-CM | POA: Diagnosis not present

## 2020-12-05 DIAGNOSIS — Z0371 Encounter for suspected problem with amniotic cavity and membrane ruled out: Secondary | ICD-10-CM | POA: Insufficient documentation

## 2020-12-05 DIAGNOSIS — O471 False labor at or after 37 completed weeks of gestation: Secondary | ICD-10-CM | POA: Diagnosis not present

## 2020-12-05 DIAGNOSIS — D509 Iron deficiency anemia, unspecified: Secondary | ICD-10-CM | POA: Insufficient documentation

## 2020-12-05 DIAGNOSIS — E669 Obesity, unspecified: Secondary | ICD-10-CM | POA: Insufficient documentation

## 2020-12-05 DIAGNOSIS — O43123 Velamentous insertion of umbilical cord, third trimester: Secondary | ICD-10-CM | POA: Insufficient documentation

## 2020-12-05 DIAGNOSIS — Z3A37 37 weeks gestation of pregnancy: Secondary | ICD-10-CM | POA: Insufficient documentation

## 2020-12-05 DIAGNOSIS — D649 Anemia, unspecified: Secondary | ICD-10-CM | POA: Insufficient documentation

## 2020-12-05 DIAGNOSIS — O99820 Streptococcus B carrier state complicating pregnancy: Secondary | ICD-10-CM | POA: Diagnosis not present

## 2020-12-05 DIAGNOSIS — O99013 Anemia complicating pregnancy, third trimester: Secondary | ICD-10-CM | POA: Insufficient documentation

## 2020-12-05 DIAGNOSIS — B951 Streptococcus, group B, as the cause of diseases classified elsewhere: Secondary | ICD-10-CM | POA: Insufficient documentation

## 2020-12-05 DIAGNOSIS — M545 Low back pain, unspecified: Secondary | ICD-10-CM | POA: Diagnosis not present

## 2020-12-05 DIAGNOSIS — O26893 Other specified pregnancy related conditions, third trimester: Secondary | ICD-10-CM | POA: Diagnosis present

## 2020-12-05 DIAGNOSIS — R7303 Prediabetes: Secondary | ICD-10-CM | POA: Diagnosis not present

## 2020-12-05 LAB — URINALYSIS, COMPLETE (UACMP) WITH MICROSCOPIC
Bacteria, UA: NONE SEEN
Bilirubin Urine: NEGATIVE
Glucose, UA: NEGATIVE mg/dL
Hgb urine dipstick: NEGATIVE
Ketones, ur: NEGATIVE mg/dL
Leukocytes,Ua: NEGATIVE
Nitrite: NEGATIVE
Protein, ur: NEGATIVE mg/dL
Specific Gravity, Urine: 1.013 (ref 1.005–1.030)
pH: 7 (ref 5.0–8.0)

## 2020-12-05 LAB — CBC WITH DIFFERENTIAL/PLATELET
Abs Immature Granulocytes: 0.07 10*3/uL (ref 0.00–0.07)
Basophils Absolute: 0 10*3/uL (ref 0.0–0.1)
Basophils Relative: 0 %
Eosinophils Absolute: 0.1 10*3/uL (ref 0.0–0.5)
Eosinophils Relative: 1 %
HCT: 28.9 % — ABNORMAL LOW (ref 36.0–46.0)
Hemoglobin: 8.9 g/dL — ABNORMAL LOW (ref 12.0–15.0)
Immature Granulocytes: 1 %
Lymphocytes Relative: 27 %
Lymphs Abs: 3 10*3/uL (ref 0.7–4.0)
MCH: 24.7 pg — ABNORMAL LOW (ref 26.0–34.0)
MCHC: 30.8 g/dL (ref 30.0–36.0)
MCV: 80.1 fL (ref 80.0–100.0)
Monocytes Absolute: 1.2 10*3/uL — ABNORMAL HIGH (ref 0.1–1.0)
Monocytes Relative: 11 %
Neutro Abs: 6.6 10*3/uL (ref 1.7–7.7)
Neutrophils Relative %: 60 %
Platelets: 281 10*3/uL (ref 150–400)
RBC: 3.61 MIL/uL — ABNORMAL LOW (ref 3.87–5.11)
RDW: 14.6 % (ref 11.5–15.5)
WBC: 10.9 10*3/uL — ABNORMAL HIGH (ref 4.0–10.5)
nRBC: 0 % (ref 0.0–0.2)

## 2020-12-05 LAB — PROTEIN / CREATININE RATIO, URINE
Creatinine, Urine: 92 mg/dL
Protein Creatinine Ratio: 0.15 mg/mg{Cre} (ref 0.00–0.15)
Total Protein, Urine: 14 mg/dL

## 2020-12-05 LAB — WET PREP, GENITAL
Clue Cells Wet Prep HPF POC: NONE SEEN
Sperm: NONE SEEN
Trich, Wet Prep: NONE SEEN
WBC, Wet Prep HPF POC: NONE SEEN
Yeast Wet Prep HPF POC: NONE SEEN

## 2020-12-05 LAB — SAMPLE TO BLOOD BANK

## 2020-12-05 LAB — RUPTURE OF MEMBRANE (ROM)PLUS: Rom Plus: NEGATIVE

## 2020-12-05 MED ORDER — SODIUM CHLORIDE 0.9 % IV SOLN
300.0000 mg | Freq: Once | INTRAVENOUS | Status: AC
  Administered 2020-12-05: 300 mg via INTRAVENOUS
  Filled 2020-12-05: qty 15

## 2020-12-05 MED ORDER — LACTATED RINGERS IV BOLUS
500.0000 mL | Freq: Once | INTRAVENOUS | Status: AC
  Administered 2020-12-05: 500 mL via INTRAVENOUS

## 2020-12-05 MED ORDER — ACETAMINOPHEN 325 MG PO TABS
650.0000 mg | ORAL_TABLET | ORAL | Status: DC | PRN
  Administered 2020-12-05: 650 mg via ORAL
  Filled 2020-12-05: qty 2

## 2020-12-05 MED ORDER — LACTATED RINGERS IV SOLN: INTRAVENOUS | Status: DC

## 2020-12-05 NOTE — OB Triage Note (Signed)
Pt reports to unit c/o constant back pain that she describes as sharp and stabbing, rating pain 10/10. Pt also reports have some white discharge yesterday but has not continued since then. Pt reports +FM, denies vaginal bleeding. Pt scheduled for a repeat c/s on 06/08 but is interested in having a vaginal delivery if possible.

## 2020-12-05 NOTE — OB Triage Note (Signed)
Discharge instructions and labor precautions reviewed with patient. All questions answered. Discussed importance of attending next prenatal appointment for BP check up, Patient verbalized understanding. Patient discharged ambulatory off the unit.

## 2020-12-05 NOTE — Discharge Instructions (Signed)
Return to hospital for any of the following:  -contractions at least 5 minutes apart  -decreased fetal movement  -vaginal bleeding  -leakage of fluid

## 2020-12-05 NOTE — Discharge Instructions (Signed)

## 2020-12-05 NOTE — Discharge Summary (Signed)
Patient ID: Victoria Elliott MRN: [id number] DOB/AGE: 1985-12-27 35 y.o.  Admit date: 12/05/2020 Discharge date: 12/05/2020  Admission Diagnoses: 34yo G4P1 at [redacted]w[redacted]d with c/o lower back pain, she also reports have some white discharge yesterday.  Pt scheduled for a repeat c/s on 06/08.  Factors complicating pregnancy:  1. Anemia in pregnancy   Getting weekly iron transfusions  2. GBS positive  3. Marginal cord insertion 4. Obesity in pregnancy - BMI 41 5. History of LTCS 6. Prediabetes  7. H/o mental health diagnoses 8. Size > Dates at 31 weeks    Discharge Diagnoses: Not in active labor  Prenatal Procedures: NST  Consults: none  Significant Diagnostic Studies:  Results for orders placed or performed during the hospital encounter of 12/05/20 (from the past 168 hour(s))  Wet prep, genital   Collection Time: 12/05/20  8:00 PM   Specimen: Urine, Clean Catch  Result Value Ref Range   Yeast Wet Prep HPF POC NONE SEEN NONE SEEN   Trich, Wet Prep NONE SEEN NONE SEEN   Clue Cells Wet Prep HPF POC NONE SEEN NONE SEEN   WBC, Wet Prep HPF POC NONE SEEN NONE SEEN   Sperm NONE SEEN   Urinalysis, Complete w Microscopic Urine, Clean Catch   Collection Time: 12/05/20  8:00 PM  Result Value Ref Range   Color, Urine YELLOW (A) YELLOW   APPearance HAZY (A) CLEAR   Specific Gravity, Urine 1.013 1.005 - 1.030   pH 7.0 5.0 - 8.0   Glucose, UA NEGATIVE NEGATIVE mg/dL   Hgb urine dipstick NEGATIVE NEGATIVE   Bilirubin Urine NEGATIVE NEGATIVE   Ketones, ur NEGATIVE NEGATIVE mg/dL   Protein, ur NEGATIVE NEGATIVE mg/dL   Nitrite NEGATIVE NEGATIVE   Leukocytes,Ua NEGATIVE NEGATIVE   RBC / HPF 0-5 0 - 5 RBC/hpf   WBC, UA 0-5 0 - 5 WBC/hpf   Bacteria, UA NONE SEEN NONE SEEN   Squamous Epithelial / LPF 6-10 0 - 5   Mucus PRESENT    Amorphous Crystal PRESENT   ROM Plus ([facility] only)   Collection Time: 12/05/20  8:00 PM  Result Value Ref Range   Rom Plus NEGATIVE   Protein / creatinine ratio,  urine   Collection Time: 12/05/20  8:00 PM  Result Value Ref Range   Creatinine, Urine 92 mg/dL   Total Protein, Urine 14 mg/dL   Protein Creatinine Ratio 0.15 0.00 - 0.15 mg/mg[Cre]  CBC with Differential/Platelet   Collection Time: 12/05/20  8:49 PM  Result Value Ref Range   WBC 10.9 (H) 4.0 - 10.5 K/uL   RBC 3.61 (L) 3.87 - 5.11 MIL/uL   Hemoglobin 8.9 (L) 12.0 - 15.0 g/dL   HCT 49.6 (L) 75.9 - 16.3 %   MCV 80.1 80.0 - 100.0 fL   MCH 24.7 (L) 26.0 - 34.0 pg   MCHC 30.8 30.0 - 36.0 g/dL   RDW 84.6 65.9 - 93.5 %   Platelets 281 150 - 400 K/uL   nRBC 0.0 0.0 - 0.2 %   Neutrophils Relative % 60 %   Neutro Abs 6.6 1.7 - 7.7 K/uL   Lymphocytes Relative 27 %   Lymphs Abs 3.0 0.7 - 4.0 K/uL   Monocytes Relative 11 %   Monocytes Absolute 1.2 (H) 0.1 - 1.0 K/uL   Eosinophils Relative 1 %   Eosinophils Absolute 0.1 0.0 - 0.5 K/uL   Basophils Relative 0 %   Basophils Absolute 0.0 0.0 - 0.1 K/uL   Immature Granulocytes 1 %  Abs Immature Granulocytes 0.07 0.00 - 0.07 K/uL  Sample to Blood Bank   Collection Time: 12/05/20  8:49 PM  Result Value Ref Range   Blood Bank Specimen PENDING    Sample Expiration      12/08/2020,2359 Performed at [facility], [address] [place], [place] [address]     Treatments: IV hydration and analgesia: acetaminophen  Hospital Course:  This is a 35 y.o. [id number] with IUP at [redacted]w[redacted]d seen for lower back pain, noted to have a cervical exam of FT/TH/High.  No leaking of fluid and no bleeding.  She was given Tylenol and IV hydration.  Pain was reduced. Labs were reassuring. She was observed, fetal heart rate monitoring remained reassuring, and she had no signs/symptoms of labor or other maternal-fetal concerns.  Her cervical exam was unchanged from admission.  She was deemed stable for discharge to home with outpatient follow up.  Discharge Physical Exam:  BP 131/66   Pulse 67   Temp 98.1 F (36.7 C) (Oral)   Resp 15   Ht 5\' 4"  (1.626 m)    Wt 111.6 kg   BMI 42.23 kg/m   General: NAD CV: RRR Pulm: CTABL, nl effort ABD: s/nd/nt, gravid DVT Evaluation: LE non-ttp, no evidence of DVT on exam.  NST: FHR baseline: 145 bpm Variability: moderate Accelerations: yes Decelerations: none Category/reactivity: reactive  TOCO: quiet SVE:   Dilation: Fingertip Effacement (%): Thick Station: Ballotable Exam by:: [phone] RN   Discharge Condition: Stable  Disposition:  Discharge disposition: 01-Home or Self Care        Allergies as of 12/05/2020   No Known Allergies     Medication List    TAKE these medications   multivitamin-prenatal 27-0.8 MG Tabs tablet Take 1 tablet by mouth daily at 12 noon.   ondansetron 4 MG disintegrating tablet Commonly known as: Zofran ODT Take 1 tablet (4 mg total) by mouth every 8 (eight) hours as needed for nausea or vomiting.   pantoprazole 40 MG tablet Commonly known as: Protonix Take 1 tablet (40 mg total) by mouth daily.        Signed5/27/2022, CNM 12/05/2020 9:59 PM

## 2020-12-11 NOTE — H&P (Signed)
Victoria Elliott is a 35 y.o. female presenting for elective cesarean + BTL . EDC 12/23/20   EGA 39+3 OB History    Gravida  4   Para  1   Term  1   Preterm      AB  2   Living  1     SAB  2   IAB      Ectopic      Multiple      Live Births  1        Obstetric Comments  1st Menstrual Cycle:  12 1st Pregnancy:  78       Past Medical History:  Diagnosis Date  . Gall stones    Past Surgical History:  Procedure Laterality Date  . CESAREAN SECTION  04-22-13  . DILATION AND EVACUATION N/A 09/25/2017   Procedure: DILATATION AND EVACUATION;  Surgeon: Christeen Douglas, MD;  Location: [facility] ORS;  Service: Gynecology;  Laterality: N/A;   Family History: family history is not on file. Social History:  reports that she quit smoking about 7 months ago. Her smoking use included cigarettes. She has a 5.00 pack-year smoking history. She has never used smokeless tobacco. She reports current alcohol use. She reports that she does not use drugs.     Maternal Diabetes: No Genetic Screening: Normal Maternal Ultrasounds/Referrals: Normal Fetal Ultrasounds or other Referrals:  None Maternal Substance Abuse:  No Significant Maternal Medications:  None Significant Maternal Lab Results:  Group B Strep positive Other Comments:  None  Review of Systems Review of Systems: A full review of systems was performed and negative except as noted in the HPI.   Eyes: no vision change  Ears: left ear pain  Oropharynx: no sore throat  Pulmonary . No shortness of breath , no hemoptysis Cardiovascular: no chest pain , no irregular heart beat  Gastrointestinal:no blood in stool . No diarrhea, no constipation Uro gynecologic: no dysuria , no pelvic pain Neurologic : no seizure , no migraines    Musculoskeletal: no muscular weakness History   unknown if currently breastfeeding. Exam Physical Exam  Lungs cta  Cv RRR Abd : gravid  Prenatal labs: ABO, Rh:  o+ Antibody:  neg Rubella:  Imm,  varicella Imm  RPR:   NR HBsAg:   Neg HIV:   Neg GBS:   Positive  Assessment/Plan: Elective repeat LTCS on 12/19/20 + BTL  Risks have been explained to pt . Risks of failure 1/300:yr   Victoria Elliott Victoria Elliott 12/11/2020, 2:58 PM

## 2020-12-13 ENCOUNTER — Inpatient Hospital Stay: Admission: RE | Admit: 2020-12-13 | Payer: 59 | Source: Ambulatory Visit

## 2020-12-17 ENCOUNTER — Other Ambulatory Visit: Payer: Self-pay

## 2020-12-17 ENCOUNTER — Encounter
Admission: EM | Disposition: A | Discharge status: Home / Self Care | Payer: Self-pay | Source: Home / Self Care | Attending: Obstetrics and Gynecology

## 2020-12-17 ENCOUNTER — Inpatient Hospital Stay: Payer: Medicaid Other

## 2020-12-17 ENCOUNTER — Inpatient Hospital Stay: Payer: Medicaid Other | Admitting: Registered Nurse

## 2020-12-17 ENCOUNTER — Other Ambulatory Visit: Payer: Medicaid Other

## 2020-12-17 ENCOUNTER — Inpatient Hospital Stay
Admission: EM | Admit: 2020-12-17 | Discharge: 2020-12-19 | DRG: 785 | Disposition: A | Discharge status: Home / Self Care | Payer: Medicaid Other | Attending: Obstetrics and Gynecology | Admitting: Obstetrics and Gynecology

## 2020-12-17 ENCOUNTER — Encounter: Payer: Self-pay | Admitting: Obstetrics and Gynecology

## 2020-12-17 DIAGNOSIS — O34211 Maternal care for low transverse scar from previous cesarean delivery: Secondary | ICD-10-CM | POA: Diagnosis present

## 2020-12-17 DIAGNOSIS — Z20822 Contact with and (suspected) exposure to covid-19: Secondary | ICD-10-CM | POA: Diagnosis present

## 2020-12-17 DIAGNOSIS — O134 Gestational [pregnancy-induced] hypertension without significant proteinuria, complicating childbirth: Secondary | ICD-10-CM | POA: Diagnosis present

## 2020-12-17 DIAGNOSIS — O9902 Anemia complicating childbirth: Secondary | ICD-10-CM | POA: Diagnosis present

## 2020-12-17 DIAGNOSIS — O99824 Streptococcus B carrier state complicating childbirth: Secondary | ICD-10-CM | POA: Diagnosis present

## 2020-12-17 DIAGNOSIS — O99892 Other specified diseases and conditions complicating childbirth: Secondary | ICD-10-CM | POA: Diagnosis present

## 2020-12-17 DIAGNOSIS — D509 Iron deficiency anemia, unspecified: Secondary | ICD-10-CM | POA: Diagnosis present

## 2020-12-17 DIAGNOSIS — R7303 Prediabetes: Secondary | ICD-10-CM | POA: Diagnosis present

## 2020-12-17 DIAGNOSIS — O43192 Other malformation of placenta, second trimester: Secondary | ICD-10-CM | POA: Diagnosis present

## 2020-12-17 DIAGNOSIS — Z87891 Personal history of nicotine dependence: Secondary | ICD-10-CM | POA: Diagnosis not present

## 2020-12-17 DIAGNOSIS — O43123 Velamentous insertion of umbilical cord, third trimester: Secondary | ICD-10-CM | POA: Diagnosis present

## 2020-12-17 DIAGNOSIS — O99214 Obesity complicating childbirth: Secondary | ICD-10-CM | POA: Diagnosis present

## 2020-12-17 DIAGNOSIS — O139 Gestational [pregnancy-induced] hypertension without significant proteinuria, unspecified trimester: Secondary | ICD-10-CM | POA: Diagnosis present

## 2020-12-17 DIAGNOSIS — O09893 Supervision of other high risk pregnancies, third trimester: Secondary | ICD-10-CM

## 2020-12-17 DIAGNOSIS — O34219 Maternal care for unspecified type scar from previous cesarean delivery: Secondary | ICD-10-CM | POA: Diagnosis present

## 2020-12-17 DIAGNOSIS — K219 Gastro-esophageal reflux disease without esophagitis: Secondary | ICD-10-CM | POA: Diagnosis present

## 2020-12-17 DIAGNOSIS — Z302 Encounter for sterilization: Secondary | ICD-10-CM

## 2020-12-17 DIAGNOSIS — Z3A39 39 weeks gestation of pregnancy: Secondary | ICD-10-CM

## 2020-12-17 DIAGNOSIS — O368390 Maternal care for abnormalities of the fetal heart rate or rhythm, unspecified trimester, not applicable or unspecified: Secondary | ICD-10-CM

## 2020-12-17 DIAGNOSIS — O9962 Diseases of the digestive system complicating childbirth: Secondary | ICD-10-CM | POA: Diagnosis present

## 2020-12-17 DIAGNOSIS — Z9889 Other specified postprocedural states: Secondary | ICD-10-CM

## 2020-12-17 LAB — RESP PANEL BY RT-PCR (FLU A&B, COVID) ARPGX2
Influenza A by PCR: NEGATIVE
Influenza B by PCR: NEGATIVE
SARS Coronavirus 2 by RT PCR: NEGATIVE

## 2020-12-17 LAB — CBC
HCT: 31.6 % — ABNORMAL LOW (ref 36.0–46.0)
Hemoglobin: 9.8 g/dL — ABNORMAL LOW (ref 12.0–15.0)
MCH: 25 pg — ABNORMAL LOW (ref 26.0–34.0)
MCHC: 31 g/dL (ref 30.0–36.0)
MCV: 80.6 fL (ref 80.0–100.0)
Platelets: 273 10*3/uL (ref 150–400)
RBC: 3.92 MIL/uL (ref 3.87–5.11)
RDW: 16.7 % — ABNORMAL HIGH (ref 11.5–15.5)
WBC: 12.8 10*3/uL — ABNORMAL HIGH (ref 4.0–10.5)
nRBC: 0 % (ref 0.0–0.2)

## 2020-12-17 LAB — PROTEIN / CREATININE RATIO, URINE
Creatinine, Urine: 129 mg/dL
Protein Creatinine Ratio: 0.25 mg/mg{Cre} — ABNORMAL HIGH (ref 0.00–0.15)
Total Protein, Urine: 32 mg/dL

## 2020-12-17 LAB — BASIC METABOLIC PANEL
Anion gap: 7 (ref 5–15)
BUN: 9 mg/dL (ref 6–20)
CO2: 24 mmol/L (ref 22–32)
Calcium: 8.5 mg/dL — ABNORMAL LOW (ref 8.9–10.3)
Chloride: 105 mmol/L (ref 98–111)
Creatinine, Ser: 0.67 mg/dL (ref 0.44–1.00)
GFR, Estimated: >60 mL/min (ref >60)
Glucose, Bld: 75 mg/dL (ref 70–99)
Potassium: 3.7 mmol/L (ref 3.5–5.1)
Sodium: 136 mmol/L (ref 135–145)

## 2020-12-17 LAB — ALT: ALT: 14 U/L (ref 0–44)

## 2020-12-17 LAB — TYPE AND SCREEN
ABO/RH(D): O POS
Antibody Screen: NEGATIVE

## 2020-12-17 LAB — AST: AST: 21 U/L (ref 15–41)

## 2020-12-17 SURGERY — Surgical Case
Anesthesia: Spinal

## 2020-12-17 MED ORDER — PRENATAL MULTIVITAMIN CH
1.0000 | ORAL_TABLET | Freq: Every day | ORAL | Status: DC
  Administered 2020-12-18: 1 via ORAL
  Filled 2020-12-17: qty 1

## 2020-12-17 MED ORDER — OXYCODONE HCL 5 MG PO TABS
5.0000 mg | ORAL_TABLET | ORAL | Status: DC | PRN
Start: 2020-12-17 — End: 2020-12-19
  Administered 2020-12-18 (×2): 5 mg via ORAL
  Filled 2020-12-17 (×2): qty 1

## 2020-12-17 MED ORDER — ENOXAPARIN SODIUM 60 MG/0.6ML IJ SOSY
55.0000 mg | PREFILLED_SYRINGE | INTRAMUSCULAR | Status: DC
  Filled 2020-12-17 (×2): qty 0.55

## 2020-12-17 MED ORDER — ACETAMINOPHEN 500 MG PO TABS: 1000.0000 mg | ORAL_TABLET | Freq: Once | ORAL | Status: DC

## 2020-12-17 MED ORDER — OXYCODONE HCL 5 MG PO TABS: 5.0000 mg | ORAL_TABLET | ORAL | Status: DC | PRN

## 2020-12-17 MED ORDER — BUPIVACAINE LIPOSOME 1.3 % IJ SUSP
INTRAMUSCULAR | Status: AC
  Filled 2020-12-17: qty 20

## 2020-12-17 MED ORDER — OXYTOCIN-SODIUM CHLORIDE 30-0.9 UT/500ML-% IV SOLN
INTRAVENOUS | Status: AC
  Filled 2020-12-17: qty 500

## 2020-12-17 MED ORDER — BUPIVACAINE HCL (PF) 0.5 % IJ SOLN
INTRAMUSCULAR | Status: DC | PRN
  Administered 2020-12-17: 30 mL

## 2020-12-17 MED ORDER — NALBUPHINE HCL 10 MG/ML IJ SOLN: 5.0000 mg | INTRAMUSCULAR | Status: DC | PRN

## 2020-12-17 MED ORDER — MORPHINE SULFATE (PF) 0.5 MG/ML IJ SOLN
INTRAMUSCULAR | Status: AC
  Filled 2020-12-17: qty 10

## 2020-12-17 MED ORDER — SOD CITRATE-CITRIC ACID 500-334 MG/5ML PO SOLN
30.0000 mL | ORAL | Status: AC
  Administered 2020-12-17: 30 mL via ORAL
  Filled 2020-12-17: qty 15

## 2020-12-17 MED ORDER — TETANUS-DIPHTH-ACELL PERTUSSIS 5-2.5-18.5 LF-MCG/0.5 IM SUSY: 0.5000 mL | PREFILLED_SYRINGE | Freq: Once | INTRAMUSCULAR | Status: DC

## 2020-12-17 MED ORDER — ACETAMINOPHEN 500 MG PO TABS
1000.0000 mg | ORAL_TABLET | Freq: Four times a day (QID) | ORAL | Status: DC
  Administered 2020-12-18 – 2020-12-19 (×2): 1000 mg via ORAL
  Filled 2020-12-17 (×2): qty 2

## 2020-12-17 MED ORDER — NALOXONE HCL 0.4 MG/ML IJ SOLN: 0.4000 mg | INTRAMUSCULAR | Status: DC | PRN

## 2020-12-17 MED ORDER — SOD CITRATE-CITRIC ACID 500-334 MG/5ML PO SOLN
30.0000 mL | ORAL | Status: DC
Start: 2020-12-17 — End: 2020-12-17

## 2020-12-17 MED ORDER — ZOLPIDEM TARTRATE 5 MG PO TABS: 5.0000 mg | ORAL_TABLET | Freq: Every evening | ORAL | Status: DC | PRN

## 2020-12-17 MED ORDER — KETOROLAC TROMETHAMINE 30 MG/ML IJ SOLN
30.0000 mg | Freq: Four times a day (QID) | INTRAMUSCULAR | Status: AC
  Administered 2020-12-17 – 2020-12-18 (×4): 30 mg via INTRAVENOUS
  Filled 2020-12-17 (×4): qty 1

## 2020-12-17 MED ORDER — GABAPENTIN 300 MG PO CAPS
300.0000 mg | ORAL_CAPSULE | Freq: Every day | ORAL | Status: DC
  Administered 2020-12-18: 300 mg via ORAL
  Filled 2020-12-17: qty 1

## 2020-12-17 MED ORDER — SODIUM CHLORIDE 0.9% FLUSH
INTRAVENOUS | Status: DC | PRN
  Administered 2020-12-17: 50 mL via INTRADERMAL

## 2020-12-17 MED ORDER — DIBUCAINE (PERIANAL) 1 % EX OINT: 1.0000 "application " | TOPICAL_OINTMENT | CUTANEOUS | Status: DC | PRN

## 2020-12-17 MED ORDER — MORPHINE SULFATE (PF) 0.5 MG/ML IJ SOLN
INTRAMUSCULAR | Status: DC | PRN
  Administered 2020-12-17: .1 mg via INTRATHECAL

## 2020-12-17 MED ORDER — IBUPROFEN 600 MG PO TABS
600.0000 mg | ORAL_TABLET | Freq: Four times a day (QID) | ORAL | Status: DC
  Administered 2020-12-18 – 2020-12-19 (×3): 600 mg via ORAL
  Filled 2020-12-17 (×3): qty 1

## 2020-12-17 MED ORDER — SIMETHICONE 80 MG PO CHEW
80.0000 mg | CHEWABLE_TABLET | Freq: Three times a day (TID) | ORAL | Status: DC
  Administered 2020-12-18 – 2020-12-19 (×4): 80 mg via ORAL
  Filled 2020-12-17 (×4): qty 1

## 2020-12-17 MED ORDER — SODIUM CHLORIDE 0.9 % IV SOLN
INTRAVENOUS | Status: DC | PRN
  Administered 2020-12-17: 50 ug/min via INTRAVENOUS

## 2020-12-17 MED ORDER — FENTANYL CITRATE (PF) 100 MCG/2ML IJ SOLN
INTRAMUSCULAR | Status: DC | PRN
  Administered 2020-12-17: 15 ug via INTRATHECAL

## 2020-12-17 MED ORDER — SODIUM CHLORIDE (PF) 0.9 % IJ SOLN
INTRAMUSCULAR | Status: AC
  Filled 2020-12-17: qty 50

## 2020-12-17 MED ORDER — LACTATED RINGERS IV SOLN: INTRAVENOUS | Status: DC

## 2020-12-17 MED ORDER — KETOROLAC TROMETHAMINE 30 MG/ML IJ SOLN: 30.0000 mg | Freq: Four times a day (QID) | INTRAMUSCULAR | Status: DC

## 2020-12-17 MED ORDER — SODIUM CHLORIDE 0.9% FLUSH: 3.0000 mL | INTRAVENOUS | Status: DC | PRN

## 2020-12-17 MED ORDER — METHYLERGONOVINE MALEATE 0.2 MG/ML IJ SOLN
INTRAMUSCULAR | Status: AC
  Filled 2020-12-17: qty 1

## 2020-12-17 MED ORDER — OXYCODONE HCL 5 MG PO TABS: 10.0000 mg | ORAL_TABLET | ORAL | Status: DC | PRN

## 2020-12-17 MED ORDER — ACETAMINOPHEN 500 MG PO TABS
1000.0000 mg | ORAL_TABLET | Freq: Four times a day (QID) | ORAL | Status: AC
  Administered 2020-12-17 – 2020-12-18 (×3): 1000 mg via ORAL
  Filled 2020-12-17 (×4): qty 2

## 2020-12-17 MED ORDER — COCONUT OIL OIL: 1.0000 "application " | TOPICAL_OIL | Status: DC | PRN

## 2020-12-17 MED ORDER — NALOXONE HCL 4 MG/10ML IJ SOLN
1.0000 ug/kg/h | INTRAVENOUS | Status: DC | PRN
  Filled 2020-12-17: qty 5

## 2020-12-17 MED ORDER — BUPIVACAINE HCL (PF) 0.5 % IJ SOLN
INTRAMUSCULAR | Status: AC
  Filled 2020-12-17: qty 30

## 2020-12-17 MED ORDER — BUPIVACAINE IN DEXTROSE 0.75-8.25 % IT SOLN
INTRATHECAL | Status: DC | PRN
  Administered 2020-12-17: 1.5 mL via INTRATHECAL

## 2020-12-17 MED ORDER — MENTHOL 3 MG MT LOZG
1.0000 | LOZENGE | OROMUCOSAL | Status: DC | PRN
  Filled 2020-12-17 (×3): qty 9

## 2020-12-17 MED ORDER — NALBUPHINE HCL 10 MG/ML IJ SOLN
5.0000 mg | Freq: Once | INTRAMUSCULAR | Status: DC | PRN
Start: 2020-12-17 — End: 2020-12-19

## 2020-12-17 MED ORDER — ONDANSETRON HCL 4 MG/2ML IJ SOLN
INTRAMUSCULAR | Status: DC | PRN
  Administered 2020-12-17: 4 mg via INTRAVENOUS

## 2020-12-17 MED ORDER — OXYTOCIN-SODIUM CHLORIDE 30-0.9 UT/500ML-% IV SOLN
2.5000 [IU]/h | INTRAVENOUS | Status: AC
  Administered 2020-12-17: 2.5 [IU]/h via INTRAVENOUS

## 2020-12-17 MED ORDER — GABAPENTIN 600 MG PO TABS
300.0000 mg | ORAL_TABLET | Freq: Once | ORAL | Status: AC
  Administered 2020-12-17: 300 mg via ORAL
  Filled 2020-12-17: qty 0.5

## 2020-12-17 MED ORDER — OXYTOCIN-SODIUM CHLORIDE 30-0.9 UT/500ML-% IV SOLN
INTRAVENOUS | Status: DC | PRN
  Administered 2020-12-17: 30 [IU] via INTRAVENOUS

## 2020-12-17 MED ORDER — WITCH HAZEL-GLYCERIN EX PADS: 1.0000 "application " | MEDICATED_PAD | CUTANEOUS | Status: DC | PRN

## 2020-12-17 MED ORDER — SIMETHICONE 80 MG PO CHEW: 80.0000 mg | CHEWABLE_TABLET | ORAL | Status: DC | PRN

## 2020-12-17 MED ORDER — SENNOSIDES-DOCUSATE SODIUM 8.6-50 MG PO TABS
2.0000 | ORAL_TABLET | Freq: Every day | ORAL | Status: DC
  Administered 2020-12-18 – 2020-12-19 (×2): 2 via ORAL
  Filled 2020-12-17 (×2): qty 2

## 2020-12-17 MED ORDER — DIPHENHYDRAMINE HCL 25 MG PO CAPS: 25.0000 mg | ORAL_CAPSULE | Freq: Four times a day (QID) | ORAL | Status: DC | PRN

## 2020-12-17 MED ORDER — ONDANSETRON HCL 4 MG/2ML IJ SOLN: 4.0000 mg | Freq: Three times a day (TID) | INTRAMUSCULAR | Status: DC | PRN

## 2020-12-17 MED ORDER — SCOPOLAMINE 1 MG/3DAYS TD PT72
1.0000 | MEDICATED_PATCH | Freq: Once | TRANSDERMAL | Status: DC
  Filled 2020-12-17: qty 1

## 2020-12-17 MED ORDER — CEFAZOLIN IN SODIUM CHLORIDE 3-0.9 GM/100ML-% IV SOLN
3.0000 g | INTRAVENOUS | Status: AC
  Administered 2020-12-17: 3 g via INTRAVENOUS
  Filled 2020-12-17: qty 100

## 2020-12-17 MED ORDER — MENTHOL 3 MG MT LOZG: 1.0000 | LOZENGE | OROMUCOSAL | Status: DC | PRN

## 2020-12-17 MED ORDER — BUPIVACAINE LIPOSOME 1.3 % IJ SUSP
INTRAMUSCULAR | Status: DC | PRN
  Administered 2020-12-17: 20 mL

## 2020-12-17 MED ORDER — ACETAMINOPHEN 500 MG PO TABS: 1000.0000 mg | ORAL_TABLET | Freq: Four times a day (QID) | ORAL | Status: DC

## 2020-12-17 MED ORDER — MORPHINE SULFATE (PF) 2 MG/ML IV SOLN: 1.0000 mg | INTRAVENOUS | Status: DC | PRN

## 2020-12-17 MED ORDER — FENTANYL CITRATE (PF) 100 MCG/2ML IJ SOLN
INTRAMUSCULAR | Status: AC
  Filled 2020-12-17: qty 2

## 2020-12-17 MED ORDER — CEFAZOLIN IN SODIUM CHLORIDE 3-0.9 GM/100ML-% IV SOLN: 3.0000 g | INTRAVENOUS | Status: DC

## 2020-12-17 SURGICAL SUPPLY — 28 items
BARRIER ADHS 3X4 INTERCEED (GAUZE/BANDAGES/DRESSINGS) ×9 IMPLANT
CHLORAPREP W/TINT 26 (MISCELLANEOUS) ×3 IMPLANT
COVER WAND RF STERILE (DRAPES) ×3 IMPLANT
DRSG TELFA 3X8 NADH (GAUZE/BANDAGES/DRESSINGS) ×3 IMPLANT
ELECT CAUTERY BLADE 6.4 (BLADE) ×3 IMPLANT
ELECT REM PT RETURN 9FT ADLT (ELECTROSURGICAL) ×3
ELECTRODE REM PT RTRN 9FT ADLT (ELECTROSURGICAL) ×1 IMPLANT
GAUZE SPONGE 4X4 12PLY STRL (GAUZE/BANDAGES/DRESSINGS) ×3 IMPLANT
GLOVE SURG SYN 8.0 (GLOVE) ×3 IMPLANT
GOWN STRL REUS W/ TWL LRG LVL3 (GOWN DISPOSABLE) ×2 IMPLANT
GOWN STRL REUS W/ TWL XL LVL3 (GOWN DISPOSABLE) ×1 IMPLANT
GOWN STRL REUS W/TWL LRG LVL3 (GOWN DISPOSABLE) ×4
GOWN STRL REUS W/TWL XL LVL3 (GOWN DISPOSABLE) ×2
MANIFOLD NEPTUNE II (INSTRUMENTS) ×3 IMPLANT
MAT PREVALON FULL STRYKER (MISCELLANEOUS) ×3 IMPLANT
NEEDLE HYPO 22GX1.5 SAFETY (NEEDLE) ×3 IMPLANT
NS IRRIG 1000ML POUR BTL (IV SOLUTION) ×3 IMPLANT
PACK C SECTION AR (MISCELLANEOUS) ×3 IMPLANT
PAD ABD 7.5X8 STRL (GAUZE/BANDAGES/DRESSINGS) ×6 IMPLANT
PAD OB MATERNITY 4.3X12.25 (PERSONAL CARE ITEMS) ×3 IMPLANT
PAD PREP 24X41 OB/GYN DISP (PERSONAL CARE ITEMS) ×3 IMPLANT
SPONGE GAUZE 4X4 12PLY (GAUZE/BANDAGES/DRESSINGS) ×3 IMPLANT
STRAP SAFETY 5IN WIDE (MISCELLANEOUS) ×3 IMPLANT
SUT CHROMIC 1 CTX 36 (SUTURE) ×9 IMPLANT
SUT PLAIN GUT 0 (SUTURE) ×6 IMPLANT
SUT VIC AB 0 CT1 36 (SUTURE) ×6 IMPLANT
SYR 30ML LL (SYRINGE) ×6 IMPLANT
TAPE MEDIFIX FOAM 3 (GAUZE/BANDAGES/DRESSINGS) ×3 IMPLANT

## 2020-12-17 NOTE — Transfer of Care (Signed)
Immediate Anesthesia Transfer of Care Note  Patient: Victoria Elliott  Procedure(s) Performed: CESAREAN SECTION  Patient Location: PACU  Anesthesia Type:Spinal  Level of Consciousness: awake, alert , oriented and patient cooperative  Airway & Oxygen Therapy: Patient Spontanous Breathing  Post-op Assessment: Report given to RN and Post -op Vital signs reviewed and stable  Post vital signs: Reviewed and stable  Last Vitals:  Vitals Value Taken Time  BP    Temp    Pulse    Resp    SpO2      Last Pain:  Vitals:   12/17/20 1137  TempSrc: Oral  PainSc: 8       Patients Stated Pain Goal: 0 (12/17/20 1137)  Complications: No complications documented.

## 2020-12-17 NOTE — OB Triage Note (Signed)
Patient is G4P1, [redacted]w[redacted]d and presents with a small amount of bloody show when she wipes. Patient states that she has intermittent back and abdominal pain, she rates 8/10. +FM. VSS. Monitors applied and assessing.

## 2020-12-17 NOTE — Progress Notes (Signed)
Spoke with radiologist. Radiologist states that BPP is 4/8, breathing 0 and AFI is under 5. Notified midwife on call and she advised physician.

## 2020-12-17 NOTE — Progress Notes (Signed)
Removed patient from monitor. Patient is going to [place] with transport.

## 2020-12-17 NOTE — Progress Notes (Signed)
PHARMACIST - PHYSICIAN COMMUNICATION  CONCERNING:  Enoxaparin (Lovenox) for DVT Prophylaxis    RECOMMENDATION: Patient was prescribed enoxaprin 40mg  q24 hours for VTE prophylaxis.   Filed Weights   12/17/20 1137  Weight: 111.6 kg (246 lb)    Body mass index is 42.23 kg/m.  Estimated Creatinine Clearance: 121.2 mL/min (by C-G formula based on SCr of 0.67 mg/dL).   Based on [facility] policy patient is candidate for enoxaparin 0.5mg /kg TBW SQ every 24 hours based on BMI being >30.   DESCRIPTION: Pharmacy has adjusted enoxaparin dose per [facility] policy.  Patient is now receiving enoxaparin 55 mg every 24 hours    [facility], PharmD, BCPS Clinical Pharmacist  12/17/2020 10:04 PM

## 2020-12-17 NOTE — Brief Op Note (Signed)
12/17/2020  5:56 PM  PATIENT:  Victoria Elliott  35 y.o. female  PRE-OPERATIVE DIAGNOSIS:  Unscheduled Cesarean Section, See Delivery Summary Elective repeat cesarean and elective sterilization , non reassurring fetal monitoring POST-OPERATIVE DIAGNOSIS:  Same as above  PROCEDURE:  Procedure(s): CESAREAN SECTION repeat LTCS and BTL   SURGEON:  Surgeon(s) and Role:    * Cola Highfill, Ihor Austin, MD - Primary  PHYSICIAN ASSISTANT: Mcvey , CNM   ASSISTANTS: Katharina Caper , CNM   ANESTHESIA:   spinal  EBL: QBL = 605 cc, iof 800 cc, uo 35 cc BLOOD ADMINISTERED:none  DRAINS: Urinary Catheter (Foley)   LOCAL MEDICATIONS USED:  MARCAINE    and BUPIVICAINE   SPECIMEN:  Source of Specimen:  portion R+L fallopian tubes   DISPOSITION OF SPECIMEN:  PATHOLOGY  COUNTS:  YES  TOURNIQUET:  * No tourniquets in log *  DICTATION: .Other Dictation: Dictation Number verbal  PLAN OF CARE: Admit to inpatient   PATIENT DISPOSITION:  PACU - hemodynamically stable.   Delay start of Pharmacological VTE agent (>24hrs) due to surgical blood loss or risk of bleeding: not applicable

## 2020-12-17 NOTE — Progress Notes (Signed)
Pt presented for cramping and bleeding today . S/p prior c/s .  BPP= 4/8  With non  Reactive NST AFI - 4 cm  I have spoken to the patient today regarding the fetal concerns based on the BPP. She has been back and forth if she wanted a TOLAC or a repeat c/s . Today she agrees to a repeat LTCS and BTL ( medicaid paperwork signed 10/09/20).  all questions answered . Risks reviewed today . Consents signed . Proceed with LTCS and BTL

## 2020-12-17 NOTE — Anesthesia Preprocedure Evaluation (Signed)
Anesthesia Evaluation  Patient identified by MRN, date of birth, ID band Patient awake    Reviewed: Allergy & Precautions, NPO status , Patient's Chart, lab work & pertinent test results  History of Anesthesia Complications Negative for: history of anesthetic complications  Airway Mallampati: III  TM Distance: >3 FB Neck ROM: Full    Dental no notable dental hx. (+) Teeth Intact   Pulmonary neg pulmonary ROS, neg sleep apnea, neg COPD, Patient abstained from smoking.Not current smoker, former smoker,    Pulmonary exam normal breath sounds clear to auscultation       Cardiovascular Exercise Tolerance: Good METS(-) hypertension(-) CAD and (-) Past MI negative cardio ROS  (-) dysrhythmias  Rhythm:Regular Rate:Normal - Systolic murmurs    Neuro/Psych negative neurological ROS  negative psych ROS   GI/Hepatic GERD  ,(+)     (-) substance abuse  ,   Endo/Other  neg diabetesMorbid obesity  Renal/GU negative Renal ROS     Musculoskeletal   Abdominal   Peds  Hematology   Anesthesia Other Findings Past Medical History: No date: Gall stones  Reproductive/Obstetrics (+) Pregnancy                            Anesthesia Physical Anesthesia Plan  ASA: III  Anesthesia Plan: Spinal   Post-op Pain Management:    Induction:   PONV Risk Score and Plan: 4 or greater and Ondansetron and Dexamethasone  Airway Management Planned: Natural Airway  Additional Equipment:   Intra-op Plan:   Post-operative Plan:   Informed Consent: I have reviewed the patients History and Physical, chart, labs and discussed the procedure including the risks, benefits and alternatives for the proposed anesthesia with the patient or authorized representative who has indicated his/her understanding and acceptance.       Plan Discussed with: CRNA and Surgeon  Anesthesia Plan Comments: (Discussed R/B/A of neuraxial  anesthesia technique with patient: - rare risks of spinal/epidural hematoma, nerve damage, infection - Risk of PDPH - Risk of itching - Risk of nausea and vomiting - Risk of conversion to general anesthesia and its associated risks, including sore throat, damage to lips/teeth/oropharynx, and rare risks such as cardiac and respiratory events. - Risk of surgical bleeding requiring blood products Patient voiced understanding.)        Anesthesia Quick Evaluation

## 2020-12-17 NOTE — Op Note (Signed)
NAMEGENEVRA, ORNE MEDICAL RECORD NO: [id number] ACCOUNT NO: [id number] DATE OF BIRTH: 04-25-1986 FACILITY: [facility] LOCATION: [facility] PHYSICIAN: Suzy Bouchard, MD  Operative Report   DATE OF PROCEDURE: 12/17/2020  PREOPERATIVE DIAGNOSES: 1.  39 plus 1 weeks estimated gestational age. 2.  Nonreassuring fetal monitoring with a biophysical profile 4/8. 3.  Elective repeat cesarean section. 4.  Elective permanent sterilization.  POSTOPERATIVE DIAGNOSES: 1.  39 plus 1 weeks estimated gestational age. 2.  Nonreassuring fetal monitoring with a biophysical profile 4/8. 3.  Elective repeat cesarean section. 4.  Elective permanent sterilization. 5.  Vigorous female delivered.  PROCEDURE: 1.  Repeat low transverse cesarean section. 2.  Bilateral tubal ligation - [place].  SURGEON:  Suzy Bouchard, MD  FIRST ASSISTANT:  Heloise Ochoa, certified nurse midwife  SECOND ASSISTANT: Rubye Oaks, certified nurse midwife.  ANESTHESIA:  Spinal.  INDICATIONS:  A 35 year old gravida 4, para 1 patient with a prior cesarean section and has elected for permanent sterilization.  The patient presented to labor and delivery with uterine cramping and vaginal bleeding. A biophysical profile was performed  for nonreassuring nonstress test and biophysical profile resulted in 4/8.  The patient was counseled and elects for repeat cesarean section and permanent sterilization.  The patient is aware of the failure rate of 1 per 300 per year.  DESCRIPTION OF PROCEDURE:  After adequate spinal anesthesia, the patient was placed in dorsal supine position, hip roll under the right side.  The patient's abdomen was prepped and draped in normal sterile fashion.  The patient did receive 3 grams IV  Ancef prior to commencement of the case.  The traxi unit was used to elevate the pannus off the lower abdomen.  A Pfannenstiel incision was made two fingerbreadths above the symphysis pubis.  Sharp dissection was  used to identify the fascia.  Fascia was  opened in the midline and opened in a transverse fashion.  The superior aspect of the fascia was grasped with Kocher clamps and the recti muscles were dissected free.  The inferior aspect of the fascia was grasped with Kocher clamps and the pyramidalis  muscle was dissected free.  Entry into the peritoneal cavity was accomplished sharply.  The vesicouterine peritoneal fold was identified and the bladder was reflected inferiorly.  Low transverse uterine incision was made.  Upon entry into the endometrial  cavity, clear fluid resulted.  Fetal head was brought to the incision and with fundal pressure, the head was delivered.  Shoulders and body were delivered without difficulty.  A large vigorous female was then dried on the mother's abdomen for 60 seconds.   Cord was clamped and infant was passed to nursery staff who assigned Apgar scores of 8 and 9, fetal weight 4105 grams.  The placenta was manually delivered and the uterus was exteriorized.  The endometrial cavity was wiped clean with laparotomy tape.   Uterine incision was closed with one chromic suture in a running locking fashion.  Good hemostasis noted.  Attention was directed to the patient's right fallopian tube, which was grasped with a Babcock clamp in the mid portion and 2 separate 0 plain gut  sutures were applied.  A 1.5 cm portion of fallopian tube was removed.  Similar procedure was repeated on the patient's left fallopian tube.  Again, grasping the fallopian tube in the midportion, two separate 0 plain gut sutures were applied and a 1.5 cm  portion of fallopian tube was removed.  Good hemostasis was noted.  Posterior cul-de-sac was irrigated and  suctioned and the uterus was placed back into the abdominal cavity.  The paracolic gutters were wiped clean with laparotomy tape.  The left tubal  ligation site demonstrated bleeding from the site and a Kelly clamp was used to clamp beneath the bleeding site and  an 0 Vicryl suture was applied and good hemostasis was noted.  The uterine incision again appeared hemostatic and Interceed was placed  over the uterine incision in a T-shape fashion.  Fascia was then closed with 0 Vicryl suture in a running nonlocking fashion.  Two separate 0 Vicryl sutures were used to close the fascia.  The fascial edges were then injected with a solution of 20 mL of  1.3% Exparel plus 30 mL 0.5% Marcaine plus 50 mL normal saline, approximately 50 mL of this solution was used.  Subcutaneous tissues were irrigated and Bovie for hemostasis and given the depth of the subcutaneous tissues of 3.5 cm, the subcutaneous  tissues were closed with a running 2-0 chromic suture.  The skin was reapproximated with Insorb absorbable staples.  Good cosmetic effect.  Additional 30 mL of Exparel solution was injected beneath the skin.  There were no complications.  Quantitative  blood loss 605 mL.  Intraoperative fluids 800 mL and urine output 35 mL. The patient was taken to the recovery room in good condition.  There were no complications.   SHW D: 12/17/2020 6:41:54 pm T: 12/17/2020 11:12:00 pm  JOB: [id number]/ [id number]

## 2020-12-17 NOTE — Discharge Summary (Signed)
Obstetrical Discharge Summary  Patient Name: Victoria Elliott DOB: [birth year] MRN: [id number]  Date of Admission: 12/17/2020 Date of Delivery: 12/17/20 Delivered by: Beverly Gust MD Date of Discharge: 12/19/2020  Primary OB: [facility]  LMP:No LMP recorded. EDC Estimated Date of Delivery: 12/23/20 Gestational Age at Delivery: [redacted]w[redacted]d   Antepartum complications:prior c/s  Admitting Diagnosis:  Secondary Diagnosis: Patient Active Problem List   Diagnosis Date Noted  . Gestational hypertension 12/18/2020  . Prediabetes 12/18/2020  . Uterine size date discrepancy, third trimester 12/18/2020  . Previous cesarean delivery affecting pregnancy 12/17/2020  . Postoperative state 12/17/2020  . Lower back pain 12/05/2020  . Gastritis 08/29/2020  . Esophageal reflux 03/29/2014    Augmentation: N/A Complications: None Intrapartum complications/course: pt admitted for cramping and vaginal bleeding BPP 4/8- pt elected for repeat LTCS and BTL ( medicaid form signed > 30 days in advance) Date of Delivery: 12/17/20 Delivered By: Beverly Gust MD Delivery Type: repeat cesarean section, low transverse incision Anesthesia: spinal Placenta: manual Laceration: none Episiotomy: none  Newborn Data: Live born female Birth Weight:  4030 gm  APGAR: , 8/9  Newborn Delivery   Birth date/time:  Delivery type:         Postpartum Procedures: Started on Procardia 30mg  QD  Edinburgh:  Edinburgh Postnatal Depression Scale Screening Tool 12/18/2020 12/18/2020  I have been able to laugh and see the funny side of things. 0 (No Data)  I have looked forward with enjoyment to things. 0 -  I have blamed myself unnecessarily when things went wrong. 1 -  I have been anxious or worried for no good reason. 1 -  I have felt scared or panicky for no good reason. 0 -  Things have been getting on top of me. 1 -  I have been so unhappy that I have had difficulty sleeping. 1 -  I have felt sad or  miserable. 1 -  I have been so unhappy that I have been crying. 0 -  The thought of harming myself has occurred to me. 0 -  Edinburgh Postnatal Depression Scale Total 5 -    Post partum course: Cesarean Section:  Patient had an uncomplicated postpartum course.  By time of discharge on POD#2, her pain was controlled on oral pain medications; she had appropriate lochia and was ambulating, voiding without difficulty, tolerating regular diet and passing flatus.   She was deemed stable for discharge to home.    Discharge Physical Exam:  BP (!) 125/57 (BP Location: Left Arm)   Pulse 75   Temp 98 F (36.7 C) (Oral)   Resp 18   Ht 5\' 4"  (1.626 m)   Wt 111.6 kg   SpO2 98%   Breastfeeding Unknown   BMI 42.23 kg/m   General: NAD CV: RRR Pulm: CTABL, nl effort ABD: s/nd/nt, fundus firm and below the umbilicus Lochia: moderate Incision: c/d/i DVT Evaluation: LE non-ttp, no evidence of DVT on exam.  Hemoglobin  Date Value Ref Range Status  12/18/2020 9.4 (L) 12.0 - 15.0 g/dL Final   HGB  Date Value Ref Range Status  08/10/2013 13.3 12.0 - 16.0 g/dL Final   HCT  Date Value Ref Range Status  12/18/2020 29.7 (L) 36.0 - 46.0 % Final  08/10/2013 40.8 35.0 - 47.0 % Final     Disposition: stable, discharge to home. Baby Feeding: breastmilk  Baby Disposition: home with mom  Rh Immune globulin given: n/a Rubella vaccine given: immune Varicella vaccine: immune Tdap vaccine given in AP  or PP setting: 10/09/20 Flu vaccine given in AP or PP setting: 04/2020  Contraception:BTL completed 12/17/20  Prenatal Labs:  ABO, Rh:  o+ Antibody:  neg Rubella:  Imm, varicella Imm  RPR:   NR HBsAg:   Neg HIV:   Neg GBS:   Positive  Plan:  Victoria Elliott was discharged to home in good condition. Follow-up appointment with delivering provider in 2 weeks.  Discharge Medications: Allergies as of 12/19/2020   No Known Allergies     Medication List    STOP taking these medications    ondansetron 4 MG disintegrating tablet Commonly known as: Zofran ODT   pantoprazole 40 MG tablet Commonly known as: Protonix     TAKE these medications   calcium carbonate 500 MG chewable tablet Commonly known as: TUMS - dosed in mg elemental calcium Chew 1 tablet by mouth daily as needed for indigestion or heartburn.   NIFEdipine 30 MG 24 hr tablet Commonly known as: ADALAT CC Take 1 tablet (30 mg total) by mouth daily.   oxyCODONE 5 MG immediate release tablet Commonly known as: Oxy IR/ROXICODONE Take 1-2 tablets (5-10 mg total) by mouth every 4 (four) hours as needed for moderate pain.        Follow-up Information    Schermerhorn, Ihor Austin, MD. Go on 12/24/2020.   Specialty: Obstetrics and Gynecology Why: post-op / blood pressure check @ 11:15 am Contact information: [address] [place] [address] [phone]               Signed: Cyril Mourning 12/19/2020 7:49 AM

## 2020-12-17 NOTE — Anesthesia Procedure Notes (Signed)
Spinal  Patient location during procedure: OR Reason for block: surgical anesthesia Staffing Performed: anesthesiologist  Anesthesiologist: Arita Miss, MD Resident/CRNA: Hedda Slade, CRNA Preanesthetic Checklist Completed: patient identified, IV checked, site marked, risks and benefits discussed, surgical consent, monitors and equipment checked, pre-op evaluation and timeout performed Spinal Block Patient position: sitting Prep: ChloraPrep Patient monitoring: heart rate, continuous pulse ox, blood pressure and cardiac monitor Approach: midline Location: L3-4 Injection technique: single-shot Needle Needle type: Whitacre and Introducer  Needle gauge: 22 G Needle length: 9 cm Assessment Sensory level: T4 Events: CSF return Additional Notes Negative paresthesia. Negative blood return. Positive free-flowing CSF. Expiration date of kit checked and confirmed. Patient tolerated procedure well, without complications.

## 2020-12-17 NOTE — Progress Notes (Signed)
Victoria Elliott is a 35 y.o. female. She is at [redacted]w[redacted]d gestation. No LMP recorded. Patient is pregnant. Estimated Date of Delivery: 12/23/20  Prenatal care site: [facility]   Current pregnancy complicated by:  1. Obesity, BMI 41 2. History of LTCS: postterm pregnancy (42 weeks) with unfavorable cervix 3. GBS positive 4. Anemia in pregnancy 5. Marginal cord insertion 6. Prediabetes, A1C 6.1, 1hr GTT 171, normal 3hr 88, 184, 133, 84   Chief complaint: painful contractions in her back, slight bloody show.   S: Resting comfortably. no CTX, no VB.no LOF,  Active fetal movement. Denies: HA, visual changes, SOB, or RUQ/epigastric pain  Maternal Medical History:   Past Medical History:  Diagnosis Date  . Gall stones     Past Surgical History:  Procedure Laterality Date  . CESAREAN SECTION  04-22-13  . DILATION AND EVACUATION N/A 09/25/2017   Procedure: DILATATION AND EVACUATION;  Surgeon: Christeen Douglas, MD;  Location: [facility] ORS;  Service: Gynecology;  Laterality: N/A;    No Known Allergies  Prior to Admission medications   Medication Sig Start Date End Date Taking? Authorizing Provider  calcium carbonate (TUMS - DOSED IN MG ELEMENTAL CALCIUM) 500 MG chewable tablet Chew 1 tablet by mouth daily as needed for indigestion or heartburn.   Yes [provider]  ondansetron (ZOFRAN ODT) 4 MG disintegrating tablet Take 1 tablet (4 mg total) by mouth every 8 (eight) hours as needed for nausea or vomiting. Patient not taking: No sig reported 08/29/20   Haroldine Laws, CNM  pantoprazole (PROTONIX) 40 MG tablet Take 1 tablet (40 mg total) by mouth daily. Patient not taking: No sig reported 08/29/20 08/29/21  Haroldine Laws, CNM      Social History: She  reports that she quit smoking about 7 months ago. Her smoking use included cigarettes. She has a 5.00 pack-year smoking history. She has never used smokeless tobacco. She reports current alcohol use. She reports that she does  not use drugs.  Family History:  no history of gyn cancers  Review of Systems: A full review of systems was performed and negative except as noted in the HPI.     O:  BP (!) 142/72 (BP Location: Right Arm)   Pulse 69   Temp 98 F (36.7 C) (Oral)   Resp 18   Ht 5\' 4"  (1.626 m)   Wt 111.6 kg   BMI 42.23 kg/m  No results found for this or any previous visit (from the past 48 hour(s)).   Constitutional: NAD, AAOx3  HE/ENT: extraocular movements grossly intact, moist mucous membranes CV: RRR PULM: nl respiratory effort, CTABL     Abd: gravid, non-tender, non-distended, soft      Ext: Non-tender, Nonedematous   Psych: mood appropriate, speech normal Pelvic: SVE- closed/75/-2  Fetal  monitoring: Cat II  Baseline: 125bpm Variability: moderate Accelerations: present x >2 Decelerations variables  OB:  AFI < 5 BPP 4/8 (off for fluid and breathing).    A/P: 35 y.o. [redacted]w[redacted]d here for antenatal surveillance for contractions  Principle Diagnosis:  Cat II tracing with variables, prior CS, non-reassuring fetal surveillance.    Labor: not present, Pt has been undecided on TOLAC vs repeat CS.    Fetal Wellbeing: Cat II tracing; BPP 4/8, AFI 4.5.   Discussed with Dr [redacted]w[redacted]d, backup physician. TJS to do surgery (previously scheduled with him)   Dalbert Garnet, CNM 12/17/2020  2:38 PM

## 2020-12-17 NOTE — Progress Notes (Signed)
Patient returned from [place]. Monitors applied and assessing.

## 2020-12-17 NOTE — Lactation Note (Signed)
This note was copied from a baby's chart. Lactation Consultation Note  Patient Name: Victoria Elliott [id number] Date: 12/17/2020 Reason for consult: Initial assessment;Mother's request;1st time breastfeeding;Term;Other (Comment) (Assisted with breast feeding after returned to room after C/S) Age:35 hours  Maternal Data Has patient been taught Hand Expression?: Yes Does the patient have breastfeeding experience prior to this delivery?: No (Did not breast feed before - could not get to latch)  Feeding Mother's Current Feeding Choice: Breast Milk   Assisted mom with first breast feeding after returning to room from C/S.  Mom has large pendulous breasts which makes it difficult for mom to see adequate latch so will need assistance for now.  Pillow was placed under breat for support.  Demonstrated how to hand express colostrum to entice [name] to latch.  [name] latched with minimal assistance and had strong, rhythmic suck with audible swallows.  He breast fed on right breast in cradle hold for 11 minutes and the left breast in football hold for 18 minutes.  Mom did not breast feed daughter reporting that she could never get her to latch.  Mom wants pump set up before discharge to begin pumping some while here in the hospital.  Encouraged mom to just concentrated on breast feeding [name] for now.  Hand out given on what to expect with breast feeding the first 4 days of life and discussed normal newborn stomach size, adequate intake and out put, how to know [name] is getting mom's milk, feeding cues, how to listen for swallows, supply and demand, normal course of lactation and routine newborn feeding patterns.  Lactation [employer] given and reviewed support groups, [employer] support, informational web sites and contact numbers.  Lactation name and number written on white board and encouraged to call with any questions, concerns or assistance.  LATCH Score Latch: Grasps breast easily,  tongue down, lips flanged, rhythmical sucking.  Audible Swallowing: A few with stimulation  Type of Nipple: Everted at rest and after stimulation  Comfort (Breast/Nipple): Soft / non-tender (Large pendulous breasts)  Hold (Positioning): Assistance needed to correctly position infant at breast and maintain latch.  LATCH Score: 8   Lactation Tools Discussed/Used    Interventions Interventions: Breast feeding basics reviewed;Assisted with latch;Skin to skin;Breast massage;Hand express;Reverse pressure;Breast compression;Adjust position;Support pillows;Position options;Education  Discharge Discharge Education: Engorgement and breast care;Warning signs for feeding baby;Other (comment) Pump:  (Mom wants DEBP set up in room - Told her we would later, but for now just concentrate on breast feeding) WIC Program: Yes  Consult Status Consult Status: Follow-up Follow-up type: Call as needed    Louis Meckel 12/17/2020, 10:08 PM

## 2020-12-18 ENCOUNTER — Encounter: Payer: Self-pay | Admitting: Obstetrics and Gynecology

## 2020-12-18 DIAGNOSIS — O43192 Other malformation of placenta, second trimester: Secondary | ICD-10-CM | POA: Diagnosis present

## 2020-12-18 DIAGNOSIS — O139 Gestational [pregnancy-induced] hypertension without significant proteinuria, unspecified trimester: Secondary | ICD-10-CM | POA: Diagnosis present

## 2020-12-18 DIAGNOSIS — O26843 Uterine size-date discrepancy, third trimester: Secondary | ICD-10-CM | POA: Insufficient documentation

## 2020-12-18 DIAGNOSIS — R7303 Prediabetes: Secondary | ICD-10-CM | POA: Insufficient documentation

## 2020-12-18 LAB — CBC
HCT: 29.7 % — ABNORMAL LOW (ref 36.0–46.0)
Hemoglobin: 9.4 g/dL — ABNORMAL LOW (ref 12.0–15.0)
MCH: 25.9 pg — ABNORMAL LOW (ref 26.0–34.0)
MCHC: 31.6 g/dL (ref 30.0–36.0)
MCV: 81.8 fL (ref 80.0–100.0)
Platelets: 244 10*3/uL (ref 150–400)
RBC: 3.63 MIL/uL — ABNORMAL LOW (ref 3.87–5.11)
RDW: 16.7 % — ABNORMAL HIGH (ref 11.5–15.5)
WBC: 13.8 10*3/uL — ABNORMAL HIGH (ref 4.0–10.5)
nRBC: 0 % (ref 0.0–0.2)

## 2020-12-18 MED ORDER — DM-GUAIFENESIN ER 30-600 MG PO TB12
1.0000 | ORAL_TABLET | Freq: Two times a day (BID) | ORAL | Status: DC
  Administered 2020-12-18 – 2020-12-19 (×3): 1 via ORAL
  Filled 2020-12-18 (×5): qty 1

## 2020-12-18 MED ORDER — NIFEDIPINE ER OSMOTIC RELEASE 30 MG PO TB24
30.0000 mg | ORAL_TABLET | Freq: Every day | ORAL | Status: DC
  Administered 2020-12-18 – 2020-12-19 (×2): 30 mg via ORAL
  Filled 2020-12-18 (×2): qty 1

## 2020-12-18 NOTE — Anesthesia Post-op Follow-up Note (Signed)
  Anesthesia Pain Follow-up Note  Patient: Willona Phariss  Day #: 1  Date of Follow-up: 12/18/2020 Time: 7:46 AM  Last Vitals:  Vitals:   12/18/20 0700 12/18/20 0731  BP:  (!) 142/66  Pulse: 60 61  Resp:    Temp:  36.6 C  SpO2: 97% 99%    Level of Consciousness: alert  Pain: none   Side Effects:None  Catheter Site Exam:clean, dry  Anti-Coag Meds (From admission, onward)   Start     Dose/Rate Route Frequency Ordered Stop   12/18/20 1800  enoxaparin (LOVENOX) injection 55 mg        55 mg Subcutaneous Every 24 hours 12/17/20 2153         Plan: D/C from anesthesia care at surgeon's request  Lynden Oxford

## 2020-12-18 NOTE — Progress Notes (Signed)
Postop Day  1  Subjective: up ad lib, voiding and tolerating PO. Reports ongoing cough that she's had for awhile.  Using pillow to support abdomen when she coughs but doesn't feel like she can clear the secretions well.   Doing well, no concerns. Ambulating without difficulty, pain managed with PO meds, tolerating regular diet, and voiding without difficulty.   No fever/chills, chest pain, shortness of breath, nausea/vomiting, or leg pain. No nipple or breast pain. No headache, visual changes, or RUQ/epigastric pain.  Objective: BP (!) 154/76 (BP Location: Left Arm)   Pulse 73   Temp 98 F (36.7 C) (Oral)   Resp 18   Ht 5\' 4"  (1.626 m)   Wt 111.6 kg   SpO2 98%   Breastfeeding Unknown   BMI 42.23 kg/m    Physical Exam:  General: alert, cooperative and no distress Breasts: soft/nontender CV: RRR Pulm: nl effort, CTABL Abdomen: soft, non-tender, active bowel sounds Uterine Fundus: firm Incision: no significant drainage Perineum: minimal edema, intact Lochia: appropriate DVT Evaluation: No evidence of DVT seen on physical exam.  Recent Labs    12/17/20 1441 12/18/20 0626  HGB 9.8* 9.4*  HCT 31.6* 29.7*  WBC 12.8* 13.8*  PLT 273 244    Assessment/Plan: 35 y.o. 20 postpartum day # 1  -Continue routine postpartum care -Lactation consult PRN for breastfeeding  -S/P BTL  -Maternal iron deficiency anemia - hemodynamically stable and asymptomatic; start PO ferrous sulfate BID with stool softeners  -Immunization status:   all immunizations up to date  -Mucinex ordered for cough -Encouraged frequent use of IS to prevent atelectasis  -Blood pressures as follows: 12/18/20 12:01:56 154/76Abnormal  12/18/20 07:31:28 142/66Abnormal  12/18/20 03:20:22 138/70  12/17/20 23:07:38 138/68  12/17/20 21:35:22 145/72Abnormal   -Normal to mild range blood pressures noted since admission, asymptomatic.  Will start on Procardia 30mg  daily    Disposition: Continue  inpatient postpartum care    LOS: 1 day   02/16/21, CNM 12/18/2020, 12:42 PM   ----- Gustavo Lah  Certified Nurse Midwife [facility]

## 2020-12-18 NOTE — Anesthesia Postprocedure Evaluation (Signed)
Anesthesia Post Note  Patient: Victoria Elliott  Procedure(s) Performed: CESAREAN SECTION  Patient location during evaluation: Mother Baby Anesthesia Type: Spinal Level of consciousness: oriented and awake and alert Pain management: pain level controlled Vital Signs Assessment: post-procedure vital signs reviewed and stable Respiratory status: spontaneous breathing and respiratory function stable Cardiovascular status: blood pressure returned to baseline and stable Postop Assessment: no headache, no backache, no apparent nausea or vomiting and able to ambulate Anesthetic complications: no   No complications documented.   Last Vitals:  Vitals:   12/18/20 0700 12/18/20 0731  BP:  (!) 142/66  Pulse: 60 61  Resp:    Temp:  36.6 C  SpO2: 97% 99%    Last Pain:  Vitals:   12/18/20 0731  TempSrc: Oral  PainSc:                  Lynden Oxford

## 2020-12-18 NOTE — Lactation Note (Signed)
This note was copied from a baby's chart. Lactation Consultation Note  Patient Name: Victoria Elliott [id number] Date: 12/18/2020 Reason for consult: Follow-up assessment;Difficult latch;1st time breastfeeding;Other (Comment) (was circ'd one hour ago) Age:35 hours Lactation to the room for follow up visit. Mother is holding the baby. Encouraged feeding on demand and with cues. If baby is not cueing encouraged hand expression and skin to skin. Taught proper technique for hand expression and spoon feeding. Mother is tender to LC touch especially on the left side. Mother was encouraged to hand express drops and a few drops were fed to the baby. Attempted to latch him in cradle on the right but baby was recently circ'd and is sleepy and disinterested. Baby was left skin to skin with Mother. Encouraged 8 or more attempts in the first 24 hours and 8 or more good feeds after 24 HOL. Reviewed appropriate diapers for days of life and How to know your baby is getting enough to eat. Reviewed "Understanding Postpartum and Newborn Care" booklet at bedside. [facility] # left on board, encouraged to call for any assistance. Mother has no further questions at this time.    Maternal Data Has patient been taught Hand Expression?: Yes Does the patient have breastfeeding experience prior to this delivery?: No  Feeding Mother's Current Feeding Choice: Breast Milk  LATCH Score Latch: Too sleepy or reluctant, no latch achieved, no sucking elicited.  Audible Swallowing: None  Type of Nipple: Everted at rest and after stimulation  Comfort (Breast/Nipple): Soft / non-tender  Hold (Positioning): Assistance needed to correctly position infant at breast and maintain latch.  LATCH Score: 5    Interventions Interventions: Breast feeding basics reviewed;Assisted with latch;Hand express;Adjust position;Support pillows;Position options;Education  Discharge Discharge Education: Warning signs for feeding baby Pump: Personal  (has a personal pump at home, unaware of brand)  Consult Status Consult Status: Follow-up Follow-up type: Call as needed    Malcomb Gangemi D Graham Doukas 12/18/2020, 11:53 AM

## 2020-12-18 NOTE — Discharge Instructions (Signed)

## 2020-12-18 NOTE — Progress Notes (Signed)
Upon entering room on two occurances during shift, RN found pillow in infant crib, while infant was not ever found on pillow in crib, RN still re-educated parentes about infant safe sleep/crib safety and fall precautions and  especially to remove all extra, and necessary items( such as a pillow ,blanket,ect) from crib that could potentially be harmful to infant. RN removed pillow from crib ,Parents noted understanding to re-education. upon PP Admission to room 340  Mother and father were education in depth about infant safe sleeping and fall/safety precautions. Parents both noted understanding to teaching.

## 2020-12-18 NOTE — Lactation Note (Signed)
This note was copied from a baby's chart. Lactation Consultation Note  Patient Name: Boy Cristian Grieves KYHCW'C Date: 12/18/2020   Age:35 hours LC back to the room. Mother stated baby latched and fed well earlier for 40 minutes. Discussed if he does not continue to feed well she will need to start pumping and supplement after 24 HOL. Mother may be interested in feeding DBM for supplementation if needed.  Kamila Broda D Aileen Amore 12/18/2020, 3:48 PM

## 2020-12-19 ENCOUNTER — Inpatient Hospital Stay
Admission: RE | Admit: 2020-12-19 | Payer: Medicaid Other | Source: Home / Self Care | Admitting: Obstetrics and Gynecology

## 2020-12-19 ENCOUNTER — Encounter: Admission: RE | Payer: Self-pay | Source: Home / Self Care

## 2020-12-19 LAB — SURGICAL PATHOLOGY

## 2020-12-19 SURGERY — Surgical Case
Anesthesia: Spinal | Laterality: Bilateral

## 2020-12-19 MED ORDER — NIFEDIPINE ER 30 MG PO TB24: 30.0000 mg | ORAL_TABLET | Freq: Every day | ORAL | 6 refills | Status: DC

## 2020-12-19 MED ORDER — OXYCODONE HCL 5 MG PO TABS: 5.0000 mg | ORAL_TABLET | ORAL | 0 refills | Status: DC | PRN

## 2020-12-19 NOTE — Lactation Note (Signed)
This note was copied from a baby's chart. Lactation Consultation Note  Patient Name: Victoria Elliott ATFTD'D Date: 12/19/2020 Reason for consult: Follow-up assessment;Term Age:35 hours  Lactation follow-up prior to anticipated discharge. This is mom's first time breastfeeding- she did not BF her first child. Initially mom was sensitive to hand expression and breastfeeding, but feels that this has improved. Baby did feed more often yesterday/overnight, and output has improved. Mom feeling confident in breastfeeding.  LC educated parents on breastfeeding expectations for the week to come, output expectations, feeding frequency and attempts- 8 or more times in 24 hours. Reviewed position/alignment, latch- deep versus shallow, growth spurts and cluster feeding, milk supply and demand, and normal course of lactation.  Reviewed with mom anticipated breast changes, breast fullness and engorgement and management of both, and nipple care. Encouraged the delay of artificial nipples and how this can impact milk production and overall feeding journey. Encouraged to review newborn education booklet.  Information for outpatient lactation services and community breastfeeding support given. Encouraged to call with questions/concerns and for ongoing BF support as needed.  Maternal Data Has patient been taught Hand Expression?: Yes Does the patient have breastfeeding experience prior to this delivery?: No (Did not BF her first)  Feeding Mother's Current Feeding Choice: Breast Milk  LATCH Score                    Lactation Tools Discussed/Used    Interventions Interventions: Breast feeding basics reviewed;Education;Hand express  Discharge Discharge Education: Engorgement and breast care;Warning signs for feeding baby;Outpatient recommendation WIC Program: Yes  Consult Status Consult Status: Complete Date: 12/19/20 Follow-up type: Call as needed    Danford Bad 12/19/2020, 9:55  AM

## 2020-12-19 NOTE — Progress Notes (Signed)
Patient discharged home with family.  Discharge instructions, when to follow up, and prescriptions reviewed with patient.  Patient verbalized understanding. Patient will be escorted out by auxiliary.   

## 2021-01-04 ENCOUNTER — Telehealth: Payer: Self-pay | Admitting: Lactation Services

## 2021-01-04 NOTE — Telephone Encounter (Signed)
Referral received 6/23 on fax machine from Dr. Earnest Conroy, mom called when fax seen this am at [phone].  Message left on voicemail.  Received call back at [phone] with message on voicemail, I then returned call and spoke with mom.  Baby was latching well in hospital, but mom began pumping and giving bottles and then baby would not latch as well, given nipple shield at dr's office yesterday and states that baby did better with this, mom back at work and only pumps 1 time at work, recommend feeding baby more frequently at breast with shield and work on increasing supply with more pumping, increase time 3-5 min post decrease in flow, should be feeding baby at least 8x per 24 hr by either breast or EBM, call for Lact consult on Tues am if stilll needs help as no LC here this weekend or Monday.

## 2021-04-03 ENCOUNTER — Emergency Department (HOSPITAL_COMMUNITY)
Admission: EM | Admit: 2021-04-03 | Discharge: 2021-04-03 | Disposition: A | Discharge status: Home / Self Care | Payer: Medicaid Other | Attending: Emergency Medicine | Admitting: Emergency Medicine

## 2021-04-03 DIAGNOSIS — R1110 Vomiting, unspecified: Secondary | ICD-10-CM | POA: Diagnosis not present

## 2021-04-03 DIAGNOSIS — R197 Diarrhea, unspecified: Secondary | ICD-10-CM | POA: Insufficient documentation

## 2021-04-03 DIAGNOSIS — R1010 Upper abdominal pain, unspecified: Secondary | ICD-10-CM | POA: Insufficient documentation

## 2021-04-03 DIAGNOSIS — Z5321 Procedure and treatment not carried out due to patient leaving prior to being seen by health care provider: Secondary | ICD-10-CM | POA: Diagnosis not present

## 2021-04-03 DIAGNOSIS — R519 Headache, unspecified: Secondary | ICD-10-CM | POA: Diagnosis not present

## 2021-04-03 LAB — URINALYSIS, ROUTINE W REFLEX MICROSCOPIC
Bilirubin Urine: NEGATIVE
Glucose, UA: NEGATIVE mg/dL
Hgb urine dipstick: NEGATIVE
Ketones, ur: NEGATIVE mg/dL
Leukocytes,Ua: NEGATIVE
Nitrite: NEGATIVE
Protein, ur: NEGATIVE mg/dL
Specific Gravity, Urine: >1.03 — ABNORMAL HIGH (ref 1.005–1.030)
pH: 6 (ref 5.0–8.0)

## 2021-04-03 LAB — CBC WITH DIFFERENTIAL/PLATELET
Abs Immature Granulocytes: 0.03 10*3/uL (ref 0.00–0.07)
Basophils Absolute: 0.1 10*3/uL (ref 0.0–0.1)
Basophils Relative: 0 %
Eosinophils Absolute: 1.1 10*3/uL — ABNORMAL HIGH (ref 0.0–0.5)
Eosinophils Relative: 9 %
HCT: 43 % (ref 36.0–46.0)
Hemoglobin: 12.9 g/dL (ref 12.0–15.0)
Immature Granulocytes: 0 %
Lymphocytes Relative: 28 %
Lymphs Abs: 3.5 10*3/uL (ref 0.7–4.0)
MCH: 24.5 pg — ABNORMAL LOW (ref 26.0–34.0)
MCHC: 30 g/dL (ref 30.0–36.0)
MCV: 81.6 fL (ref 80.0–100.0)
Monocytes Absolute: 0.8 10*3/uL (ref 0.1–1.0)
Monocytes Relative: 6 %
Neutro Abs: 6.9 10*3/uL (ref 1.7–7.7)
Neutrophils Relative %: 57 %
Platelets: 281 10*3/uL (ref 150–400)
RBC: 5.27 MIL/uL — ABNORMAL HIGH (ref 3.87–5.11)
RDW: 15.9 % — ABNORMAL HIGH (ref 11.5–15.5)
WBC: 12.4 10*3/uL — ABNORMAL HIGH (ref 4.0–10.5)
nRBC: 0 % (ref 0.0–0.2)

## 2021-04-03 LAB — COMPREHENSIVE METABOLIC PANEL
ALT: 17 U/L (ref 0–44)
AST: 20 U/L (ref 15–41)
Albumin: 3.3 g/dL — ABNORMAL LOW (ref 3.5–5.0)
Alkaline Phosphatase: 173 U/L — ABNORMAL HIGH (ref 38–126)
Anion gap: 7 (ref 5–15)
BUN: 6 mg/dL (ref 6–20)
CO2: 26 mmol/L (ref 22–32)
Calcium: 9.3 mg/dL (ref 8.9–10.3)
Chloride: 105 mmol/L (ref 98–111)
Creatinine, Ser: 0.97 mg/dL (ref 0.44–1.00)
GFR, Estimated: >60 mL/min (ref >60)
Glucose, Bld: 107 mg/dL — ABNORMAL HIGH (ref 70–99)
Potassium: 4 mmol/L (ref 3.5–5.1)
Sodium: 138 mmol/L (ref 135–145)
Total Bilirubin: 0.5 mg/dL (ref 0.3–1.2)
Total Protein: 7.4 g/dL (ref 6.5–8.1)

## 2021-04-03 LAB — LIPASE, BLOOD: Lipase: 76 U/L — ABNORMAL HIGH (ref 11–51)

## 2021-04-03 LAB — I-STAT BETA HCG BLOOD, ED (MC, WL, AP ONLY): I-stat hCG, quantitative: <5 m[IU]/mL (ref <5)

## 2021-04-03 NOTE — ED Triage Notes (Signed)
Pt with mid upper abdominal pain for the last 3 weeks. PT described as a burning. Pt reports several episodes of vomiting, diarrhea, headaches with pain.

## 2021-04-03 NOTE — ED Provider Notes (Signed)
Emergency Medicine Provider Triage Evaluation Note  Victoria Elliott , a 35 y.o. female  was evaluated in triage.  Pt complains of dairrhea for the past month with intermittent/episodic epigastric abdominal pain, nausea, and vomiting. The patient reports she was tired of dealing with her symptoms and wanted evaluation today. Patient had a c-section in June 2022, but reports this pain is in her upper abdomen. Denies any fevers, SOB, or chest pain.   Review of Systems  Positive: Abdominal pain, diarrhea, nausea, vomiting Negative:     chest pain, SOB, fevers  Physical Exam  BP 132/69 (BP Location: Right Arm)   Pulse 61   Temp 98.9 F (37.2 C)   Resp 16   SpO2 100%  Gen:   Awake, no distress   Resp:  Normal effort  MSK:   Moves extremities without difficulty  Other:  Mild abdominal tenderness. No guarding or rebound. No peritoneal signs.   Medical Decision Making  Medically screening exam initiated at 3:21 PM.  Appropriate orders placed.  Victoria Elliott was informed that the remainder of the evaluation will be completed by another provider, this initial triage assessment does not replace that evaluation, and the importance of remaining in the ED until their evaluation is complete.  Patient is stable for continuation of care by another provider.    Achille Rich, PA-C 04/03/21 1525    Gerhard Munch, MD 04/05/21 2351

## 2021-04-03 NOTE — ED Notes (Signed)
Pt stated she was going to leave.

## 2021-04-07 ENCOUNTER — Emergency Department
Admission: EM | Admit: 2021-04-07 | Discharge: 2021-04-07 | Disposition: A | Discharge status: Home / Self Care | Payer: Medicaid Other | Attending: Emergency Medicine | Admitting: Emergency Medicine

## 2021-04-07 ENCOUNTER — Other Ambulatory Visit: Payer: Self-pay

## 2021-04-07 DIAGNOSIS — R109 Unspecified abdominal pain: Secondary | ICD-10-CM | POA: Insufficient documentation

## 2021-04-07 DIAGNOSIS — R112 Nausea with vomiting, unspecified: Secondary | ICD-10-CM | POA: Insufficient documentation

## 2021-04-07 DIAGNOSIS — R519 Headache, unspecified: Secondary | ICD-10-CM | POA: Diagnosis not present

## 2021-04-07 DIAGNOSIS — E119 Type 2 diabetes mellitus without complications: Secondary | ICD-10-CM | POA: Insufficient documentation

## 2021-04-07 DIAGNOSIS — R197 Diarrhea, unspecified: Secondary | ICD-10-CM | POA: Insufficient documentation

## 2021-04-07 DIAGNOSIS — Z87891 Personal history of nicotine dependence: Secondary | ICD-10-CM | POA: Insufficient documentation

## 2021-04-07 DIAGNOSIS — Z20822 Contact with and (suspected) exposure to covid-19: Secondary | ICD-10-CM | POA: Diagnosis not present

## 2021-04-07 DIAGNOSIS — D72829 Elevated white blood cell count, unspecified: Secondary | ICD-10-CM | POA: Insufficient documentation

## 2021-04-07 LAB — COMPREHENSIVE METABOLIC PANEL
ALT: 23 U/L (ref 0–44)
AST: 29 U/L (ref 15–41)
Albumin: 3.6 g/dL (ref 3.5–5.0)
Alkaline Phosphatase: 174 U/L — ABNORMAL HIGH (ref 38–126)
Anion gap: 8 (ref 5–15)
BUN: 9 mg/dL (ref 6–20)
CO2: 26 mmol/L (ref 22–32)
Calcium: 8.7 mg/dL — ABNORMAL LOW (ref 8.9–10.3)
Chloride: 105 mmol/L (ref 98–111)
Creatinine, Ser: 0.84 mg/dL (ref 0.44–1.00)
GFR, Estimated: >60 mL/min (ref >60)
Glucose, Bld: 92 mg/dL (ref 70–99)
Potassium: 3.9 mmol/L (ref 3.5–5.1)
Sodium: 139 mmol/L (ref 135–145)
Total Bilirubin: 0.5 mg/dL (ref 0.3–1.2)
Total Protein: 7.4 g/dL (ref 6.5–8.1)

## 2021-04-07 LAB — CBC
HCT: 41.3 % (ref 36.0–46.0)
Hemoglobin: 12.5 g/dL (ref 12.0–15.0)
MCH: 24.2 pg — ABNORMAL LOW (ref 26.0–34.0)
MCHC: 30.3 g/dL (ref 30.0–36.0)
MCV: 79.9 fL — ABNORMAL LOW (ref 80.0–100.0)
Platelets: 279 10*3/uL (ref 150–400)
RBC: 5.17 MIL/uL — ABNORMAL HIGH (ref 3.87–5.11)
RDW: 15.8 % — ABNORMAL HIGH (ref 11.5–15.5)
WBC: 10.8 10*3/uL — ABNORMAL HIGH (ref 4.0–10.5)
nRBC: 0 % (ref 0.0–0.2)

## 2021-04-07 LAB — URINALYSIS, COMPLETE (UACMP) WITH MICROSCOPIC
Bilirubin Urine: NEGATIVE
Glucose, UA: NEGATIVE mg/dL
Ketones, ur: NEGATIVE mg/dL
Leukocytes,Ua: NEGATIVE
Nitrite: NEGATIVE
Protein, ur: NEGATIVE mg/dL
Specific Gravity, Urine: 1.008 (ref 1.005–1.030)
pH: 5 (ref 5.0–8.0)

## 2021-04-07 LAB — POC URINE PREG, ED: Preg Test, Ur: NEGATIVE

## 2021-04-07 LAB — RESP PANEL BY RT-PCR (FLU A&B, COVID) ARPGX2
Influenza A by PCR: NEGATIVE
Influenza B by PCR: NEGATIVE
SARS Coronavirus 2 by RT PCR: NEGATIVE

## 2021-04-07 LAB — LIPASE, BLOOD: Lipase: 61 U/L — ABNORMAL HIGH (ref 11–51)

## 2021-04-07 MED ORDER — DICYCLOMINE HCL 10 MG PO CAPS: 20.0000 mg | ORAL_CAPSULE | Freq: Three times a day (TID) | ORAL | 0 refills | Status: DC

## 2021-04-07 MED ORDER — ONDANSETRON 4 MG PO TBDP
4.0000 mg | ORAL_TABLET | Freq: Once | ORAL | Status: AC
  Administered 2021-04-07: 4 mg via ORAL
  Filled 2021-04-07: qty 1

## 2021-04-07 MED ORDER — SODIUM CHLORIDE 0.9 % IV BOLUS
1000.0000 mL | Freq: Once | INTRAVENOUS | Status: AC
  Administered 2021-04-07: 1000 mL via INTRAVENOUS

## 2021-04-07 MED ORDER — IBUPROFEN 400 MG PO TABS
400.0000 mg | ORAL_TABLET | Freq: Once | ORAL | Status: AC
  Administered 2021-04-07: 400 mg via ORAL
  Filled 2021-04-07: qty 1

## 2021-04-07 NOTE — ED Provider Notes (Signed)
[facility]  ____________________________________________   Event Date/Time   First MD Initiated Contact with Patient 04/07/21 1142     (approximate)  I have reviewed the triage vital signs and the nursing notes.   HISTORY  Chief Complaint Abdominal Pain    HPI Victoria Elliott is a 35 y.o. female past medical history of diabetes, esophageal reflux, gallstones who presents with diarrhea.  She endorses about 3 weeks of constant diarrhea.  She is having multiple episodes per day.  Denies blood in the stool.  She has not had any fevers or chills.  She has associate abdominal cramping which is not constant, comes and goes.  Has had occasional nausea and vomiting but this is not consistent.  She also notes a mild headache over the bout the same timeframe.  Patient denies any recent travel or recent antibiotic use.  She is about 3 months postpartum, no longer breast-feeding.         Past Medical History:  Diagnosis Date   Gall stones     Patient Active Problem List   Diagnosis Date Noted   Gestational hypertension 12/18/2020   Prediabetes 12/18/2020   Uterine size date discrepancy, third trimester 12/18/2020   Previous cesarean delivery affecting pregnancy 12/17/2020   Postoperative state 12/17/2020   Lower back pain 12/05/2020   Gastritis 08/29/2020   Esophageal reflux 03/29/2014    Past Surgical History:  Procedure Laterality Date   CESAREAN SECTION  04-22-13   CESAREAN SECTION  12/17/2020   Procedure: CESAREAN SECTION;  Surgeon: Schermerhorn, Ihor Austin, MD;  Location: [facility] ORS;  Service: Obstetrics;;   DILATION AND EVACUATION N/A 09/25/2017   Procedure: DILATATION AND EVACUATION;  Surgeon: Christeen Douglas, MD;  Location: [facility] ORS;  Service: Gynecology;  Laterality: N/A;    Prior to Admission medications   Medication Sig Start Date End Date Taking? Authorizing Provider  dicyclomine (BENTYL) 10 MG capsule Take 2 capsules (20 mg total) by mouth 4  (four) times daily -  before meals and at bedtime. 04/07/21 05/07/21 Yes Georga Hacking, MD  calcium carbonate (TUMS - DOSED IN MG ELEMENTAL CALCIUM) 500 MG chewable tablet Chew 1 tablet by mouth daily as needed for indigestion or heartburn.    [provider]  NIFEdipine (ADALAT CC) 30 MG 24 hr tablet Take 1 tablet (30 mg total) by mouth daily. 12/19/20   Haroldine Laws, CNM  oxyCODONE (OXY IR/ROXICODONE) 5 MG immediate release tablet Take 1-2 tablets (5-10 mg total) by mouth every 4 (four) hours as needed for moderate pain. 12/19/20   Haroldine Laws, CNM    Allergies Patient has no known allergies.  No family history on file.  Social History Social History   Tobacco Use   Smoking status: Former    Packs/day: 0.50    Years: 10.00    Pack years: 5.00    Types: Cigarettes    Quit date: 04/22/2020    Years since quitting: 0.9   Smokeless tobacco: Never  Vaping Use   Vaping Use: Never used  Substance Use Topics   Alcohol use: Yes    Comment: 1-2 occasionally   Drug use: No    Review of Systems   Review of Systems  Constitutional:  Negative for appetite change, chills, fever and unexpected weight change.  Gastrointestinal:  Positive for abdominal distention and diarrhea. Negative for abdominal pain, nausea and vomiting.  Genitourinary:  Negative for dysuria.  All other systems reviewed and are negative.  Physical Exam Updated Vital Signs  BP 132/84 (BP Location: Left Arm)   Pulse 63   Temp 98.3 F (36.8 C) (Oral)   Resp 17   Ht 5\' 4"  (1.626 m)   Wt 106.6 kg   LMP  (LMP Unknown) Comment: Had baby 3 months ago.  SpO2 99%   Breastfeeding No   BMI 40.34 kg/m   Physical Exam Vitals and nursing note reviewed.  Constitutional:      General: She is not in acute distress.    Appearance: Normal appearance. She is well-developed.  HENT:     Head: Normocephalic and atraumatic.  Eyes:     General: No scleral icterus.    Conjunctiva/sclera: Conjunctivae normal.   Pulmonary:     Effort: Pulmonary effort is normal. No respiratory distress.     Breath sounds: No stridor.  Abdominal:     General: Abdomen is flat.     Palpations: Abdomen is soft.     Tenderness: There is no abdominal tenderness.     Comments: Abdomen is soft and nontender throughout  Musculoskeletal:        General: No deformity or signs of injury.     Cervical back: Normal range of motion.  Skin:    General: Skin is warm and dry.     Coloration: Skin is not jaundiced or pale.  Neurological:     General: No focal deficit present.     Mental Status: She is alert and oriented to person, place, and time. Mental status is at baseline.  Psychiatric:        Mood and Affect: Mood normal.        Behavior: Behavior normal.     LABS (all labs ordered are listed, but only abnormal results are displayed)  Labs Reviewed  LIPASE, BLOOD - Abnormal; Notable for the following components:      Result Value   Lipase 61 (*)    All other components within normal limits  COMPREHENSIVE METABOLIC PANEL - Abnormal; Notable for the following components:   Calcium 8.7 (*)    Alkaline Phosphatase 174 (*)    All other components within normal limits  CBC - Abnormal; Notable for the following components:   WBC 10.8 (*)    RBC 5.17 (*)    MCV 79.9 (*)    MCH 24.2 (*)    RDW 15.8 (*)    All other components within normal limits  URINALYSIS, COMPLETE (UACMP) WITH MICROSCOPIC - Abnormal; Notable for the following components:   Color, Urine STRAW (*)    APPearance CLEAR (*)    Hgb urine dipstick LARGE (*)    Bacteria, UA RARE (*)    All other components within normal limits  RESP PANEL BY RT-PCR (FLU A&B, COVID) ARPGX2  GASTROINTESTINAL PANEL BY PCR, STOOL (REPLACES STOOL CULTURE)  C DIFFICILE QUICK SCREEN W PCR REFLEX    POC URINE PREG, ED   ____________________________________________  EKG  NSR, nml axis, nml intervals, no acute ischemic  changes  ____________________________________________  RADIOLOGY , personally viewed and evaluated these images (plain radiographs) as part of my medical decision making, as well as reviewing the written report by the radiologist.  ED MD interpretation:  n/a    ____________________________________________   PROCEDURES  Procedure(s) performed (including Critical Care):  Procedures   ____________________________________________   INITIAL IMPRESSION / ASSESSMENT AND PLAN / ED COURSE     35 year old female presents with 3 weeks of diarrhea.  She has had occasional nausea and vomiting as well as  headache.  No significant abdominal pain or fevers.  No recent antibiotic use, blood in the stool or recent travel.  On exam she is well-appearing.  Labs obtained from triage are also reassuring.  Lipase insignificantly elevated.  No electrode abnormality, mild leukocytosis of 10.  Her abdomen is benign low suspicion for surgical pathology.  Patient was given a liter fluid and Zofran.  I had hoped to get a stool sample to test for C. difficile as well as other pathogen, but pt was not able to produce a sample in the ED. Will give script for dycyclomine for abdominal cramping. Will refer pt to outpt clinic as well as GI.       ____________________________________________   FINAL CLINICAL IMPRESSION(S) / ED DIAGNOSES  Final diagnoses:  Diarrhea, unspecified type     ED Discharge Orders          Ordered    dicyclomine (BENTYL) 10 MG capsule  3 times daily before meals & bedtime        04/07/21 1406             Note:  This document was prepared using Dragon voice recognition software and may include unintentional dictation errors.    Georga Hacking, MD 04/07/21 [phone]

## 2021-04-07 NOTE — Discharge Instructions (Addendum)
Your blood work was reassuring today. Please follow-up in the [facility] for primary care. Please call to schedule an appointment with the Gastroenterologist. You can try dicyclomine for abdominal cramping.

## 2021-04-07 NOTE — ED Triage Notes (Addendum)
Pt to ER via POV with complaints of epigastric pain x3 weeks. Reports initially she had diarrhea, but now is having abdominal pains, nausea, some vomiting, and a headache.   Pt also reports bowel incontinence. Reports having lower to mid back pain when incontinent. Reports having an epidural x3 months ago for childbirth via c-section. Denies numbness/ tingling in legs.

## 2021-04-22 ENCOUNTER — Ambulatory Visit (INDEPENDENT_AMBULATORY_CARE_PROVIDER_SITE_OTHER): Payer: Medicaid Other | Admitting: Gastroenterology

## 2021-04-22 ENCOUNTER — Other Ambulatory Visit: Payer: Self-pay

## 2021-04-22 ENCOUNTER — Encounter: Payer: Self-pay | Admitting: Gastroenterology

## 2021-04-22 VITALS — BP 125/85 | HR 68 | Temp 98.6°F | Wt 237.6 lb

## 2021-04-22 DIAGNOSIS — K529 Noninfective gastroenteritis and colitis, unspecified: Secondary | ICD-10-CM

## 2021-04-22 DIAGNOSIS — R748 Abnormal levels of other serum enzymes: Secondary | ICD-10-CM

## 2021-04-22 DIAGNOSIS — R1013 Epigastric pain: Secondary | ICD-10-CM

## 2021-04-22 DIAGNOSIS — N939 Abnormal uterine and vaginal bleeding, unspecified: Secondary | ICD-10-CM | POA: Insufficient documentation

## 2021-04-22 NOTE — Progress Notes (Signed)
[name], MD [address]  [address]  [place], [place] [address]  Main: [phone]  Fax: [phone]    Gastroenterology Consultation  Referring Provider:     No ref. provider found Primary Care Physician:  Patient, No Pcp Per (Inactive) Primary Gastroenterologist:  Dr. [name] Reason for Consultation:     Epigastric pain, diarrhea        HPI:   Victoria Elliott is a 35 y.o. female referred by Dr. Patient, No Pcp Per (Inactive)  for consultation & management of epigastric pain, diarrhea.  Patient reports that for almost a month, she has been experiencing epigastric pain which was radiating to mid upper back, currently in between the shoulder blades.  Her pain is mostly postprandial.  She describes the pain as throbbing unless she has heartburn that leads to burning in her chest.  Associated with nausea, vomiting and nonbloody loose stools.  She reports that her bowel movements are anywhere from 5-7 on Bristol stool scale, up to 4-5 times daily and sometimes at night.  She also had episodes of fecal incontinence.  Denies any rectal bleeding.  She does report some chills, denies fever.  She is currently breast-feeding, has a 66-month-old baby.  She denies having GI symptoms in her pregnancy and her pregnancy was uneventful.  She has 2 kids.  Patient was evaluated in the ER for the above symptoms on 9/21 which revealed alkaline phosphatase 173, BMP and rest of the LFTs normal, T bili 0.5, lipase 76, CBC revealed mild leukocytosis.  She went to the ER again on 9/25 with ongoing symptoms, repeat blood tests revealed mildly elevated alkaline phosphatase and serum lipase.  Patient was prescribed dicyclomine and referred to GI for further evaluation.   Patient does not smoke or drink alcohol She denies carbonated beverages  NSAIDs: None  Antiplts/Anticoagulants/Anti thrombotics: None  GI Procedures: None  Patient denies family history of IBD, GI malignancy  Past Medical  History:  Diagnosis Date   Gall stones     Past Surgical History:  Procedure Laterality Date   CESAREAN SECTION  04-22-13   CESAREAN SECTION  12/17/2020   Procedure: CESAREAN SECTION;  Surgeon: Schermerhorn, Ihor Austin, MD;  Location: [facility] ORS;  Service: Obstetrics;;   DILATION AND EVACUATION N/A 09/25/2017   Procedure: DILATATION AND EVACUATION;  Surgeon: Christeen Douglas, MD;  Location: [facility] ORS;  Service: Gynecology;  Laterality: N/A;    Current Outpatient Medications:    calcium carbonate (TUMS - DOSED IN MG ELEMENTAL CALCIUM) 500 MG chewable tablet, Chew 1 tablet by mouth daily as needed for indigestion or heartburn., Disp: , Rfl:    dicyclomine (BENTYL) 10 MG capsule, Take 2 capsules (20 mg total) by mouth 4 (four) times daily -  before meals and at bedtime., Disp: 240 capsule, Rfl: 0   ibuprofen (ADVIL) 200 MG tablet, Take by mouth., Disp: , Rfl:    NIFEdipine (ADALAT CC) 30 MG 24 hr tablet, Take 1 tablet (30 mg total) by mouth daily., Disp: 30 tablet, Rfl: 6   norgestimate-ethinyl estradiol (ORTHO-CYCLEN) 0.25-35 MG-MCG tablet, PLEASE SEE ATTACHED FOR DETAILED DIRECTIONS, Disp: , Rfl:    norgestimate-ethinyl estradiol (ORTHO-CYCLEN) 0.25-35 MG-MCG tablet, Take by mouth., Disp: , Rfl:    oxyCODONE (OXY IR/ROXICODONE) 5 MG immediate release tablet, Take 1-2 tablets (5-10 mg total) by mouth every 4 (four) hours as needed for moderate pain., Disp: 30 tablet, Rfl: 0    History reviewed. No pertinent family history.   Social History  Tobacco Use   Smoking status: Former    Packs/day: 0.50    Years: 10.00    Pack years: 5.00    Types: Cigarettes    Quit date: 04/22/2020    Years since quitting: 1.0   Smokeless tobacco: Never  Vaping Use   Vaping Use: Never used  Substance Use Topics   Alcohol use: Yes    Comment: 1-2 occasionally   Drug use: No    Allergies as of 04/22/2021   (No Known Allergies)    Review of Systems:    All systems reviewed and negative except where  noted in HPI.   Physical Exam:  BP 125/85 (BP Location: Left Arm, Patient Position: Sitting, Cuff Size: Normal)   Pulse 68   Temp 98.6 F (37 C) (Oral)   Wt 237 lb 9.6 oz (107.8 kg)   LMP  (LMP Unknown) Comment: Had baby 3 months ago.  BMI 40.78 kg/m  No LMP recorded (lmp unknown).  General:   Alert,  Well-developed, well-nourished, pleasant and cooperative in NAD Head:  Normocephalic and atraumatic. Eyes:  Sclera clear, no icterus.   Conjunctiva pink. Ears:  Normal auditory acuity. Nose:  No deformity, discharge, or lesions. Mouth:  No deformity or lesions,oropharynx pink & moist. Neck:  Supple; no masses or thyromegaly. Lungs:  Respirations even and unlabored.  Clear throughout to auscultation.   No wheezes, crackles, or rhonchi. No acute distress. Heart:  Regular rate and rhythm; no murmurs, clicks, rubs, or gallops. Abdomen:  Normal bowel sounds. Soft, mild epigastric tenderness and non-distended without masses, hepatosplenomegaly or hernias noted.  No guarding or rebound tenderness.   Rectal: Not performed Msk:  Symmetrical without gross deformities. Good, equal movement & strength bilaterally. Pulses:  Normal pulses noted. Extremities:  No clubbing or edema.  No cyanosis. Neurologic:  Alert and oriented x3;  grossly normal neurologically. Skin:  Intact without significant lesions or rashes. No jaundice. Psych:  Alert and cooperative. Normal mood and affect.  Imaging Studies: None  Assessment and Plan:   Victoria Elliott is a 35 y.o. pleasant African-American female with BMI 40.78 is seen in consultation for 1 month history of epigastric pain associated with nausea, vomiting and nonbloody diarrhea  Differentials include H. pylori gastritis or severe GERD or biliary colic or an IBD Recommend H. pylori breath test Check GI profile PCR, fecal calprotectin levels  Isolated elevated alkaline phosphatase Recheck LFTs today Recommend right upper quadrant ultrasound  I have  discussed about low-fat diet until above test results   Follow up in 23months   [name], MD

## 2021-04-22 NOTE — Patient Instructions (Addendum)
Your RUQ ultrasound is schedule for 04/25/2021 [place] 10:00am [facility]. Nothing to eat or drink after midnight.

## 2021-04-23 LAB — H. PYLORI BREATH TEST: H pylori Breath Test: NEGATIVE

## 2021-04-23 LAB — HEPATIC FUNCTION PANEL
ALT: 14 IU/L (ref 0–32)
AST: 18 IU/L (ref 0–40)
Albumin: 4.1 g/dL (ref 3.8–4.8)
Alkaline Phosphatase: 190 IU/L — ABNORMAL HIGH (ref 44–121)
Bilirubin Total: 0.2 mg/dL (ref 0.0–1.2)
Bilirubin, Direct: <0.1 mg/dL (ref 0.00–0.40)
Total Protein: 7.3 g/dL (ref 6.0–8.5)

## 2021-04-25 ENCOUNTER — Other Ambulatory Visit: Payer: Self-pay

## 2021-04-25 ENCOUNTER — Ambulatory Visit
Admission: RE | Admit: 2021-04-25 | Discharge: 2021-04-25 | Disposition: A | Discharge status: Home / Self Care | Payer: Medicaid Other | Source: Ambulatory Visit | Attending: Gastroenterology | Admitting: Gastroenterology

## 2021-04-25 DIAGNOSIS — R1013 Epigastric pain: Secondary | ICD-10-CM | POA: Insufficient documentation

## 2021-04-29 ENCOUNTER — Other Ambulatory Visit: Payer: Self-pay | Admitting: Gastroenterology

## 2021-04-29 DIAGNOSIS — R748 Abnormal levels of other serum enzymes: Secondary | ICD-10-CM

## 2021-04-30 ENCOUNTER — Telehealth: Payer: Self-pay

## 2021-04-30 ENCOUNTER — Other Ambulatory Visit: Payer: Self-pay

## 2021-04-30 DIAGNOSIS — R748 Abnormal levels of other serum enzymes: Secondary | ICD-10-CM

## 2021-04-30 NOTE — Telephone Encounter (Signed)
CALLED PATIENT NO ANSWER NO WAY TO LEAVE MESSAGE

## 2021-04-30 NOTE — Telephone Encounter (Signed)
-----   Message from Toney Reil, MD sent at 04/29/2021  4:30 PM EDT ----- Please inform patient that the ultrasound of her gallbladder do suggest that she has gallstones which can lead to abdominal pain.  Because one of her liver enzymes are elevated, before I refer her to general surgery, I would like to do some more blood tests to make sure we are not dealing with any underlying liver issue  I have ordered labs, please release and inform patient  Thank you  RV

## 2021-05-01 ENCOUNTER — Telehealth: Payer: Self-pay

## 2021-05-01 NOTE — Telephone Encounter (Signed)
-----   Message from Rohini Reddy Vanga, MD sent at 04/29/2021  4:30 PM EDT ----- Please inform patient that the ultrasound of her gallbladder do suggest that she has gallstones which can lead to abdominal pain.  Because one of her liver enzymes are elevated, before I refer her to general surgery, I would like to do some more blood tests to make sure we are not dealing with any underlying liver issue  I have ordered labs, please release and inform patient  Thank you  RV 

## 2021-05-01 NOTE — Telephone Encounter (Signed)
Called patient and her mom left message with mom to please have her call us back no way to leave message on her phone

## 2021-05-01 NOTE — Telephone Encounter (Signed)
Called patient she understands her results and will try and come by our office on thurs but may wait and go to a [facility] drawing station on friday

## 2021-05-01 NOTE — Telephone Encounter (Signed)
Called patient she had questions about her results and where to get labs done all was answered patient understands

## 2021-05-02 ENCOUNTER — Encounter: Payer: Self-pay | Admitting: Gastroenterology

## 2021-05-02 LAB — GI PROFILE, STOOL, PCR

## 2021-05-02 LAB — CALPROTECTIN, FECAL: Calprotectin, Fecal: 87 ug/g (ref 0–120)

## 2021-05-08 LAB — ANA: Anti Nuclear Antibody (ANA): NEGATIVE

## 2021-05-11 LAB — ANTI-SMOOTH MUSCLE ANTIBODY, IGG: Smooth Muscle Ab: 8 Units (ref 0–19)

## 2021-05-11 LAB — ALKALINE PHOSPHATASE, ISOENZYMES
Alkaline Phosphatase: 172 IU/L — ABNORMAL HIGH (ref 44–121)
BONE FRACTION: 35 % (ref 14–68)
INTESTINAL FRAC.: 2 % (ref 0–18)
LIVER FRACTION: 63 % (ref 18–85)

## 2021-05-11 LAB — MITOCHONDRIAL ANTIBODIES: Mitochondrial Ab: <20 Units (ref 0.0–20.0)

## 2021-05-11 LAB — GAMMA GT: GGT: 8 IU/L (ref 0–60)

## 2021-05-14 ENCOUNTER — Telehealth: Payer: Self-pay

## 2021-05-14 ENCOUNTER — Other Ambulatory Visit: Payer: Self-pay

## 2021-05-14 DIAGNOSIS — K219 Gastro-esophageal reflux disease without esophagitis: Secondary | ICD-10-CM

## 2021-05-14 MED ORDER — PEG 3350-KCL-NA BICARB-NACL 420 G PO SOLR: 4000.0000 mL | Freq: Once | ORAL | 0 refills | Status: AC

## 2021-05-14 NOTE — Telephone Encounter (Signed)
Called patient about results and also scheduled a egd as recommended for 06/12/2021

## 2021-05-14 NOTE — Telephone Encounter (Signed)
-----   Message from Toney Reil, MD sent at 05/13/2021 10:14 AM EDT ----- Please inform patient that her blood tests related to liver came back normal.  She has fatty liver.  If she has ongoing GI symptoms, I recommend upper endoscopy first before referring to general surgery.  Please schedule upper endoscopy if patient is agreeable  Rohini Vanga

## 2021-06-10 ENCOUNTER — Telehealth: Payer: Self-pay

## 2021-06-10 ENCOUNTER — Telehealth: Payer: Self-pay | Admitting: Gastroenterology

## 2021-06-10 NOTE — Telephone Encounter (Signed)
Patient wants to reschedule procedure. Clinical staff will follow up with patient. 

## 2021-06-10 NOTE — Telephone Encounter (Signed)
Called to reschedule her procedure as she requested she will call us back when she is ready to reschedule

## 2021-06-12 ENCOUNTER — Encounter: Admission: RE | Payer: Self-pay | Source: Home / Self Care

## 2021-06-12 ENCOUNTER — Ambulatory Visit: Admission: RE | Admit: 2021-06-12 | Payer: Medicaid Other | Source: Home / Self Care | Admitting: Gastroenterology

## 2021-06-12 SURGERY — ESOPHAGOGASTRODUODENOSCOPY (EGD) WITH PROPOFOL
Anesthesia: General

## 2021-07-01 ENCOUNTER — Telehealth: Payer: Self-pay | Admitting: Gastroenterology

## 2021-07-01 NOTE — Telephone Encounter (Signed)
Patient wants to reschedule procedure that was cancelled on 06/12/2021. Clinical staff will follow up with patient.

## 2021-07-03 ENCOUNTER — Other Ambulatory Visit: Payer: Self-pay

## 2021-07-03 DIAGNOSIS — K219 Gastro-esophageal reflux disease without esophagitis: Secondary | ICD-10-CM

## 2021-07-03 NOTE — Progress Notes (Signed)
Called and got patient rescheduled for 07/31/2021 for egd sent new referral to nicole and sent new communications

## 2021-07-31 ENCOUNTER — Encounter
Admission: RE | Disposition: A | Discharge status: Home / Self Care | Payer: Self-pay | Source: Home / Self Care | Attending: Gastroenterology

## 2021-07-31 ENCOUNTER — Encounter: Payer: Self-pay | Admitting: Gastroenterology

## 2021-07-31 ENCOUNTER — Ambulatory Visit: Payer: Medicaid Other | Admitting: Anesthesiology

## 2021-07-31 ENCOUNTER — Ambulatory Visit
Admission: RE | Admit: 2021-07-31 | Discharge: 2021-07-31 | Disposition: A | Discharge status: Home / Self Care | Payer: Medicaid Other | Attending: Gastroenterology | Admitting: Gastroenterology

## 2021-07-31 DIAGNOSIS — Z6841 Body Mass Index (BMI) 40.0 and over, adult: Secondary | ICD-10-CM | POA: Diagnosis not present

## 2021-07-31 DIAGNOSIS — K319 Disease of stomach and duodenum, unspecified: Secondary | ICD-10-CM

## 2021-07-31 DIAGNOSIS — R1013 Epigastric pain: Secondary | ICD-10-CM | POA: Diagnosis present

## 2021-07-31 DIAGNOSIS — Z87891 Personal history of nicotine dependence: Secondary | ICD-10-CM | POA: Insufficient documentation

## 2021-07-31 DIAGNOSIS — G8929 Other chronic pain: Secondary | ICD-10-CM | POA: Diagnosis not present

## 2021-07-31 DIAGNOSIS — K219 Gastro-esophageal reflux disease without esophagitis: Secondary | ICD-10-CM | POA: Diagnosis not present

## 2021-07-31 DIAGNOSIS — K449 Diaphragmatic hernia without obstruction or gangrene: Secondary | ICD-10-CM | POA: Insufficient documentation

## 2021-07-31 DIAGNOSIS — K3189 Other diseases of stomach and duodenum: Secondary | ICD-10-CM | POA: Insufficient documentation

## 2021-07-31 DIAGNOSIS — K2950 Unspecified chronic gastritis without bleeding: Secondary | ICD-10-CM | POA: Insufficient documentation

## 2021-07-31 HISTORY — PX: ESOPHAGOGASTRODUODENOSCOPY (EGD) WITH PROPOFOL: SHX5813

## 2021-07-31 LAB — POCT PREGNANCY, URINE: Preg Test, Ur: NEGATIVE

## 2021-07-31 SURGERY — ESOPHAGOGASTRODUODENOSCOPY (EGD) WITH PROPOFOL
Anesthesia: General

## 2021-07-31 MED ORDER — MIDAZOLAM HCL 2 MG/2ML IJ SOLN
INTRAMUSCULAR | Status: DC | PRN
  Administered 2021-07-31: 2 mg via INTRAVENOUS

## 2021-07-31 MED ORDER — SODIUM CHLORIDE 0.9 % IV SOLN
INTRAVENOUS | Status: DC
  Administered 2021-07-31: 1000 mL via INTRAVENOUS

## 2021-07-31 MED ORDER — MIDAZOLAM HCL 2 MG/2ML IJ SOLN
INTRAMUSCULAR | Status: AC
  Filled 2021-07-31: qty 2

## 2021-07-31 MED ORDER — LIDOCAINE HCL (CARDIAC) PF 100 MG/5ML IV SOSY
PREFILLED_SYRINGE | INTRAVENOUS | Status: DC | PRN
Start: 2021-07-31 — End: 2021-07-31
  Administered 2021-07-31: 100 mg via INTRAVENOUS

## 2021-07-31 MED ORDER — PROPOFOL 10 MG/ML IV BOLUS
INTRAVENOUS | Status: DC | PRN
  Administered 2021-07-31: 100 mg via INTRAVENOUS
  Administered 2021-07-31: 150 mg via INTRAVENOUS

## 2021-07-31 NOTE — Op Note (Signed)
[facility] Gastroenterology Patient Name: Victoria Elliott Procedure Date: 07/31/2021 11:33 AM MRN: [id number] Account #: [id number] Date of Birth: Feb 10, 1986 Admit Type: Outpatient Age: 36 Room: [facility] ENDO ROOM 4 Gender: Female Note Status: Finalized Instrument Name: Altamese Cabal Endoscope [id number] Procedure:             Upper GI endoscopy Indications:           Epigastric abdominal pain Providers:             Lin Landsman MD, MD Medicines:             General Anesthesia Complications:         No immediate complications. Estimated blood loss: None. Procedure:             Pre-Anesthesia Assessment:                        - Prior to the procedure, a History and Physical was                         performed, and patient medications and allergies were                         reviewed. The patient is competent. The risks and                         benefits of the procedure and the sedation options and                         risks were discussed with the patient. All questions                         were answered and informed consent was obtained.                         Patient identification and proposed procedure were                         verified by the physician, the nurse, the                         anesthesiologist, the anesthetist and the technician                         in the pre-procedure area in the procedure room in the                         endoscopy suite. Mental Status Examination: alert and                         oriented. Airway Examination: normal oropharyngeal                         airway and neck mobility. Respiratory Examination:                         clear to auscultation. CV Examination: normal.                         Prophylactic Antibiotics:  The patient does not require                         prophylactic antibiotics. Prior Anticoagulants: The                         patient has taken no previous anticoagulant or                          antiplatelet agents. ASA Grade Assessment: II - A                         patient with mild systemic disease. After reviewing                         the risks and benefits, the patient was deemed in                         satisfactory condition to undergo the procedure. The                         anesthesia plan was to use general anesthesia.                         Immediately prior to administration of medications,                         the patient was re-assessed for adequacy to receive                         sedatives. The heart rate, respiratory rate, oxygen                         saturations, blood pressure, adequacy of pulmonary                         ventilation, and response to care were monitored                         throughout the procedure. The physical status of the                         patient was re-assessed after the procedure.                        After obtaining informed consent, the endoscope was                         passed under direct vision. Throughout the procedure,                         the patient's blood pressure, pulse, and oxygen                         saturations were monitored continuously. The Endoscope                         was introduced through the mouth, and advanced to the  second part of duodenum. The upper GI endoscopy was                         accomplished without difficulty. The patient tolerated                         the procedure well. Findings:      The duodenal bulb and second portion of the duodenum were normal.      Diffuse mildly erythematous mucosa without bleeding was found in the       gastric antrum. Biopsies were taken with a cold forceps for histology.      The gastric body and incisura were normal. Biopsies were taken with a       cold forceps for Helicobacter pylori testing.      A small hiatal hernia was present.      The cardia and gastric fundus were normal on  retroflexion.      Esophagogastric landmarks were identified: the gastroesophageal junction       was found at 35 cm from the incisors.      The gastroesophageal junction and examined esophagus were normal. Impression:            - Normal duodenal bulb and second portion of the                         duodenum.                        - Erythematous mucosa in the antrum. Biopsied.                        - Normal gastric body and incisura. Biopsied.                        - Small hiatal hernia.                        - Esophagogastric landmarks identified.                        - Normal gastroesophageal junction and esophagus. Recommendation:        - Discharge patient to home (with escort).                        - Resume previous diet today.                        - Continue present medications.                        - Await pathology results. Procedure Code(s):     --- Professional ---                        [phone], Esophagogastroduodenoscopy, flexible,                         transoral; with biopsy, single or multiple Diagnosis Code(s):     --- Professional ---                        K31.89, Other diseases of stomach and duodenum  K44.9, Diaphragmatic hernia without obstruction or                         gangrene                        R10.13, Epigastric pain CPT copyright 2019 American Medical Association. All rights reserved. The codes documented in this report are preliminary and upon coder review may  be revised to meet current compliance requirements. Dr. Ulyess Mort Lin Landsman MD, MD 07/31/2021 11:56:49 AM This report has been signed electronically. Number of Addenda: 0 Note Initiated On: 07/31/2021 11:33 AM Estimated Blood Loss:  Estimated blood loss: none.      [facility]

## 2021-07-31 NOTE — H&P (Signed)
[name], MD [address]  [address]  [place], [place] [address]  Main: [phone]  Fax: [phone] Pager: [phone]  Primary Care Physician:  Patient, No Pcp Per (Inactive) Primary Gastroenterologist:  Dr. [name]  Pre-Procedure History & Physical: HPI:  Victoria Elliott is a 36 y.o. female is here for an endoscopy.   Past Medical History:  Diagnosis Date   Gall stones     Past Surgical History:  Procedure Laterality Date   CESAREAN SECTION  04/22/2013   CESAREAN SECTION  12/17/2020   Procedure: CESAREAN SECTION;  Surgeon: Schermerhorn, Ihor Austin, MD;  Location: [facility] ORS;  Service: Obstetrics;;   DILATION AND CURETTAGE OF UTERUS     DILATION AND EVACUATION N/A 09/25/2017   Procedure: DILATATION AND EVACUATION;  Surgeon: Christeen Douglas, MD;  Location: [facility] ORS;  Service: Gynecology;  Laterality: N/A;    Prior to Admission medications   Medication Sig Start Date End Date Taking? Authorizing Provider  calcium carbonate (TUMS - DOSED IN MG ELEMENTAL CALCIUM) 500 MG chewable tablet Chew 1 tablet by mouth daily as needed for indigestion or heartburn.   Yes [provider]  ibuprofen (ADVIL) 200 MG tablet Take by mouth.   Yes [provider]  NIFEdipine (ADALAT CC) 30 MG 24 hr tablet Take 1 tablet (30 mg total) by mouth daily. 12/19/20  Yes Haroldine Laws, CNM  norgestimate-ethinyl estradiol (ORTHO-CYCLEN) 0.25-35 MG-MCG tablet PLEASE SEE ATTACHED FOR DETAILED DIRECTIONS 01/24/21  Yes [provider]  norgestimate-ethinyl estradiol (ORTHO-CYCLEN) 0.25-35 MG-MCG tablet Take by mouth. 01/24/21  Yes [provider]  dicyclomine (BENTYL) 10 MG capsule Take 2 capsules (20 mg total) by mouth 4 (four) times daily -  before meals and at bedtime. 04/07/21 05/07/21  Georga Hacking, MD  oxyCODONE (OXY IR/ROXICODONE) 5 MG immediate release tablet Take 1-2 tablets (5-10 mg total) by mouth every 4 (four) hours as needed for moderate  pain. Patient not taking: Reported on 07/31/2021 12/19/20   Haroldine Laws, CNM    Allergies as of 07/03/2021   (No Known Allergies)    History reviewed. No pertinent family history.  Social History   Socioeconomic History   Marital status: Single    Spouse name: Not on file   Number of children: Not on file   Years of education: Not on file   Highest education level: Not on file  Occupational History   Not on file  Tobacco Use   Smoking status: Former    Packs/day: 0.50    Years: 10.00    Pack years: 5.00    Types: Cigarettes    Quit date: 04/22/2020    Years since quitting: 1.2   Smokeless tobacco: Never  Vaping Use   Vaping Use: Never used  Substance and Sexual Activity   Alcohol use: Yes    Comment: 1-2 occasionally   Drug use: No   Sexual activity: Yes    Comment: BTL  Other Topics Concern   Not on file  Social History Narrative   Not on file   Social Determinants of Health   Financial Resource Strain: Not on file  Food Insecurity: Not on file  Transportation Needs: Not on file  Physical Activity: Not on file  Stress: Not on file  Social Connections: Not on file  Intimate Partner Violence: Not on file    Review of Systems: See HPI, otherwise negative ROS  Physical Exam: BP 131/75    Pulse 69    Temp (!) 96.2 F (  35.7 C) (Temporal)    Resp 18    Ht 5\' 4"  (1.626 m)    Wt 107.7 kg    LMP 07/09/2021 Comment: Pregnancy test negative   SpO2 100%    BMI 40.76 kg/m  General:   Alert,  pleasant and cooperative in NAD Head:  Normocephalic and atraumatic. Neck:  Supple; no masses or thyromegaly. Lungs:  Clear throughout to auscultation.    Heart:  Regular rate and rhythm. Abdomen:  Soft, nontender and nondistended. Normal bowel sounds, without guarding, and without rebound.   Neurologic:  Alert and  oriented x4;  grossly normal neurologically.  Impression/Plan: Damira Kem is here for an endoscopy to be performed for epigastric pain  Risks, benefits,  limitations, and alternatives regarding  endoscopy have been reviewed with the patient.  Questions have been answered.  All parties agreeable.   [name], MD  07/31/2021, 10:50 AM

## 2021-07-31 NOTE — Transfer of Care (Signed)
Immediate Anesthesia Transfer of Care Note  Patient: Farrell Broerman  Procedure(s) Performed: ESOPHAGOGASTRODUODENOSCOPY (EGD) WITH PROPOFOL  Patient Location: PACU  Anesthesia Type:MAC  Level of Consciousness: awake  Airway & Oxygen Therapy: Patient Spontanous Breathing  Post-op Assessment: Report given to RN  Post vital signs: Reviewed and stable  Last Vitals:  Vitals Value Taken Time  BP    Temp    Pulse 74 07/31/21 1204  Resp 24 07/31/21 1204  SpO2 98 % 07/31/21 1204  Vitals shown include unvalidated device data.  Last Pain:  Vitals:   07/31/21 1028  TempSrc: Temporal  PainSc: 0-No pain         Complications: No notable events documented.

## 2021-07-31 NOTE — Anesthesia Postprocedure Evaluation (Signed)
Anesthesia Post Note  Patient: Teaching laboratory technician  Procedure(s) Performed: ESOPHAGOGASTRODUODENOSCOPY (EGD) WITH PROPOFOL  Patient location during evaluation: PACU Anesthesia Type: General Level of consciousness: awake and alert, oriented and patient cooperative Pain management: pain level controlled Vital Signs Assessment: post-procedure vital signs reviewed and stable Respiratory status: spontaneous breathing, nonlabored ventilation and respiratory function stable Cardiovascular status: blood pressure returned to baseline and stable Postop Assessment: adequate PO intake Anesthetic complications: no   No notable events documented.   Last Vitals:  Vitals:   07/31/21 1210 07/31/21 1220  BP: (!) 101/59 127/76  Pulse:    Resp: 20 20  Temp:    SpO2:      Last Pain:  Vitals:   07/31/21 1220  TempSrc:   PainSc: 0-No pain                 Darrin Nipper

## 2021-07-31 NOTE — Anesthesia Preprocedure Evaluation (Addendum)
Anesthesia Evaluation  Patient identified by MRN, date of birth, ID band Patient awake    Reviewed: Allergy & Precautions, NPO status , Patient's Chart, lab work & pertinent test results  History of Anesthesia Complications Negative for: history of anesthetic complications  Airway Mallampati: I   Neck ROM: Full    Dental  (+)    Pulmonary former smoker (quit 2021),    Pulmonary exam normal breath sounds clear to auscultation       Cardiovascular Exercise Tolerance: Good Normal cardiovascular exam Rhythm:Regular Rate:Normal  ECG 04/07/21: Sinus bradycardia with sinus arrhythmia Low voltage QRS Inferior infarct , age undetermined Cannot rule out Anterior infarct , age undetermined   Neuro/Psych PSYCHIATRIC DISORDERS (postpartum depression)    GI/Hepatic GERD  ,  Endo/Other  Class 3 obesity   Renal/GU negative Renal ROS     Musculoskeletal   Abdominal   Peds  Hematology negative hematology ROS (+)   Anesthesia Other Findings   Reproductive/Obstetrics                            Anesthesia Physical Anesthesia Plan  ASA: 3  Anesthesia Plan: General   Post-op Pain Management:    Induction: Intravenous  PONV Risk Score and Plan: 3 and Propofol infusion, TIVA and Treatment may vary due to age or medical condition  Airway Management Planned: Natural Airway  Additional Equipment:   Intra-op Plan:   Post-operative Plan:   Informed Consent: I have reviewed the patients History and Physical, chart, labs and discussed the procedure including the risks, benefits and alternatives for the proposed anesthesia with the patient or authorized representative who has indicated his/her understanding and acceptance.       Plan Discussed with: CRNA  Anesthesia Plan Comments: (LMA/GETA backup discussed.  Patient consented for risks of anesthesia including but not limited to:  - adverse  reactions to medications - damage to eyes, teeth, lips or other oral mucosa - nerve damage due to positioning  - sore throat or hoarseness - damage to heart, brain, nerves, lungs, other parts of body or loss of life  Informed patient about role of CRNA in peri- and intra-operative care.  Patient voiced understanding.)       Anesthesia Quick Evaluation

## 2021-08-01 ENCOUNTER — Encounter: Payer: Self-pay | Admitting: Gastroenterology

## 2021-08-01 ENCOUNTER — Telehealth: Payer: Self-pay

## 2021-08-01 DIAGNOSIS — K8020 Calculus of gallbladder without cholecystitis without obstruction: Secondary | ICD-10-CM

## 2021-08-01 LAB — SURGICAL PATHOLOGY

## 2021-08-01 NOTE — Telephone Encounter (Signed)
Placed referral  

## 2021-08-01 NOTE — Telephone Encounter (Signed)
-----   Message from Toney Reil, MD sent at 08/01/2021  3:11 PM EST ----- Morrie Sheldon  Please refer patient to general surgery Dx: Symptomatic cholelithiasis, EGD normal  Rohini Vanga

## 2021-08-05 NOTE — Telephone Encounter (Signed)
Updated patient chart with new phone number

## 2022-03-17 IMAGING — US US ABDOMEN LIMITED
1 series · 15 of 25 positions shown · non-contrast
Comparison: None.

CLINICAL DATA: Epigastric pain for 1 month.

EXAM:
ULTRASOUND ABDOMEN LIMITED RIGHT UPPER QUADRANT

[Series 1: us abdomen limited ruq · 15 of 34 slices shown]
[im 1/34]
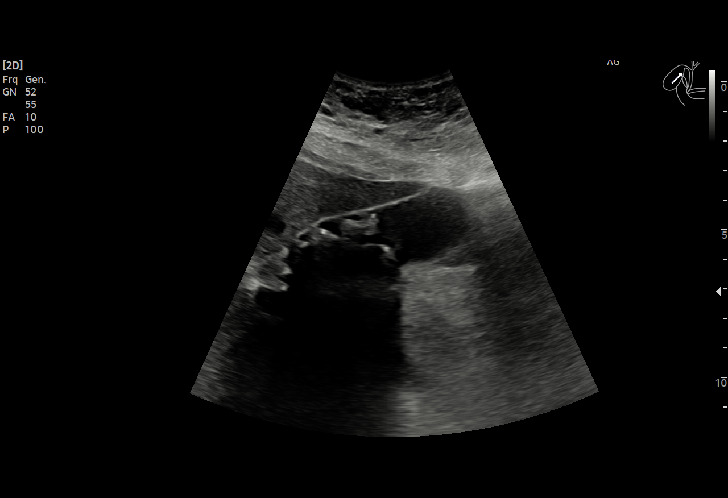
[im 3/34]
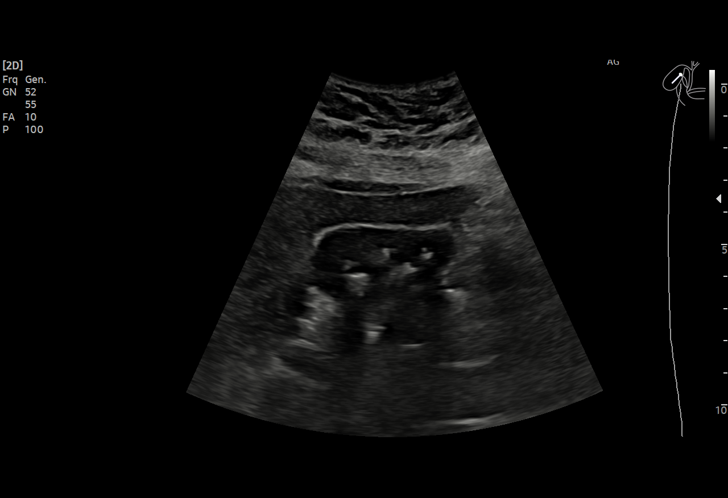
[im 6/34]
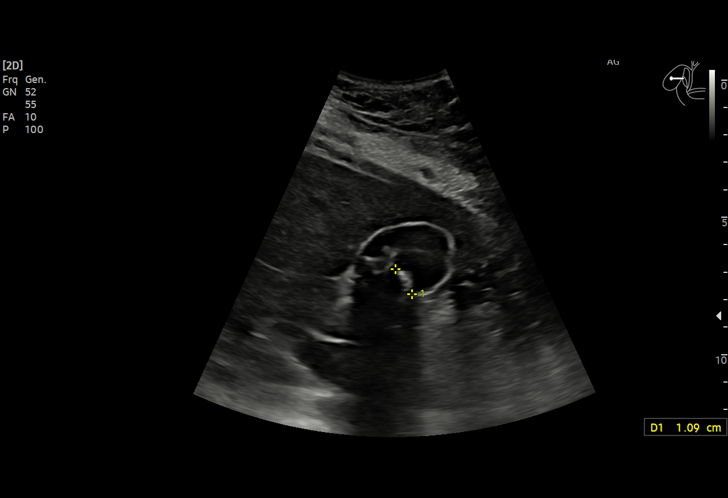
[im 7/34]
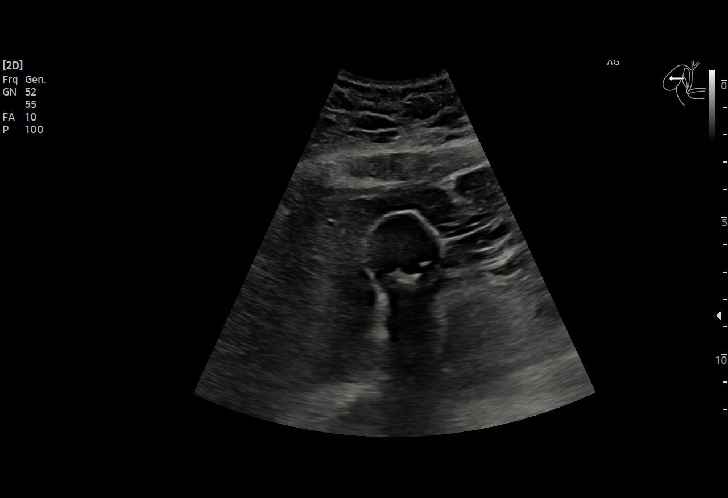
[im 10/34]
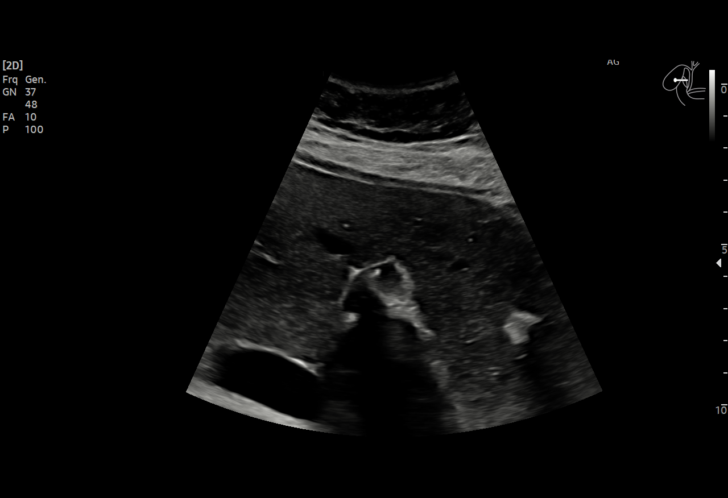
[im 13/34]
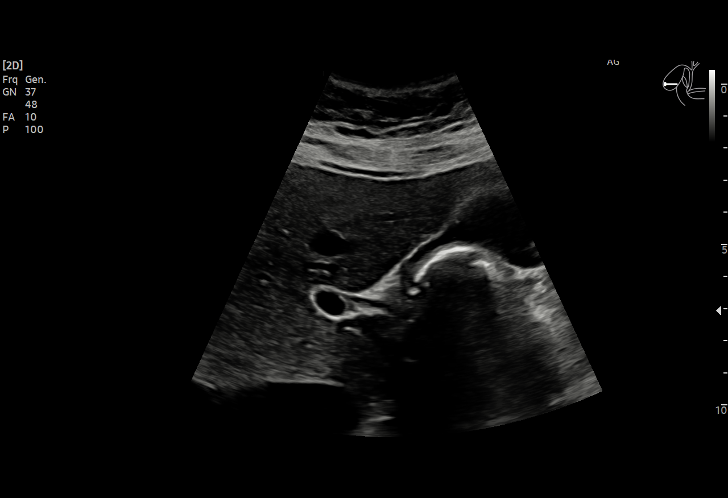
[im 14/34]
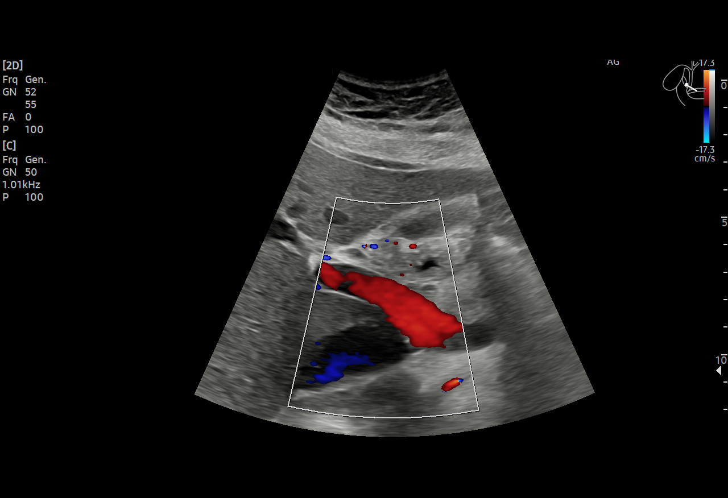
[im 17/34]
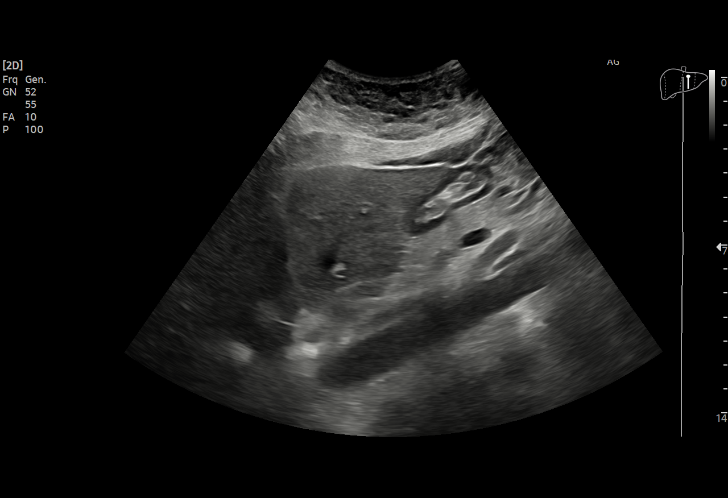
[im 20/34]
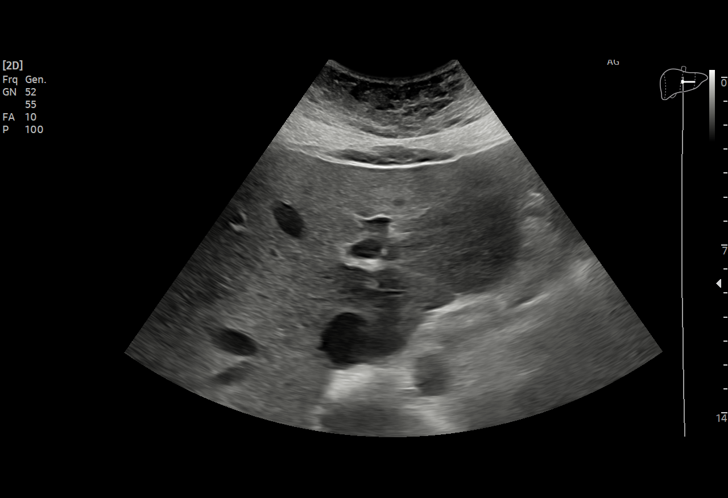
[im 21/34]
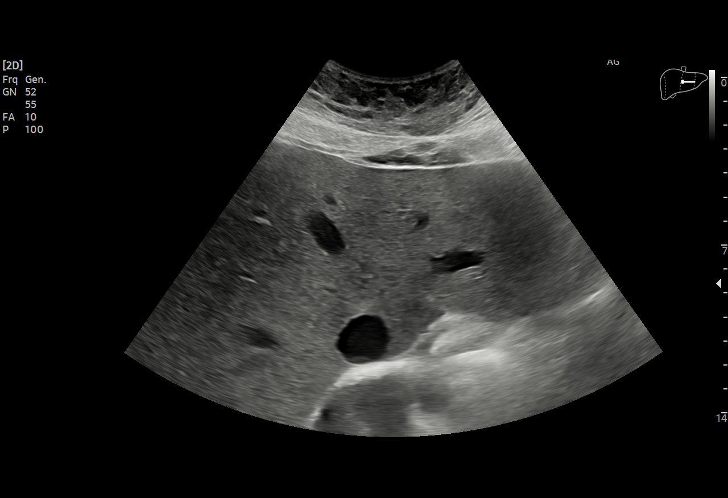
[im 24/34]
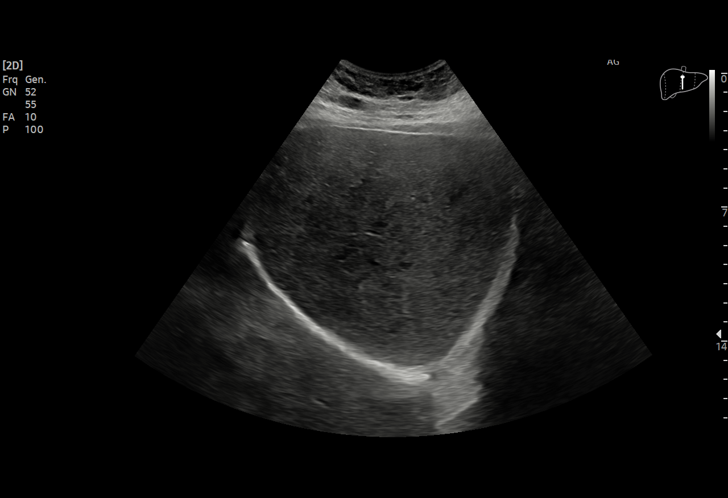
[im 27/34]
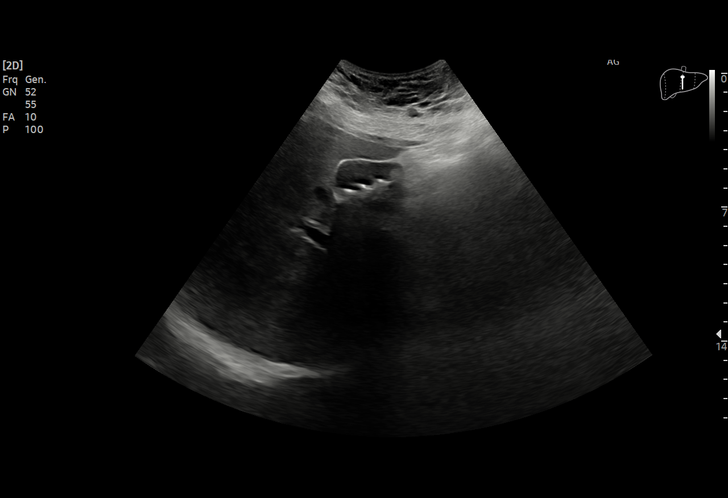
[im 28/34]
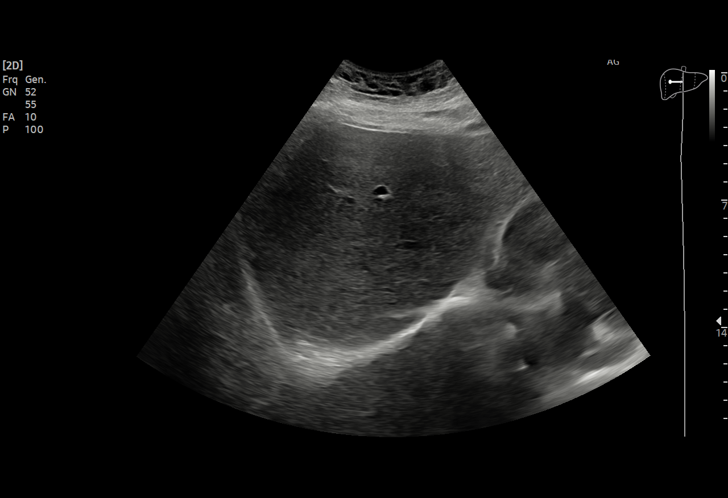
[im 31/34]
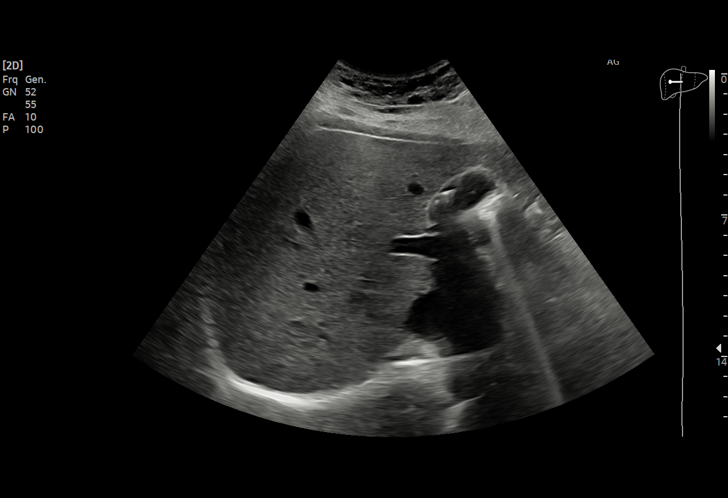
[im 34/34]
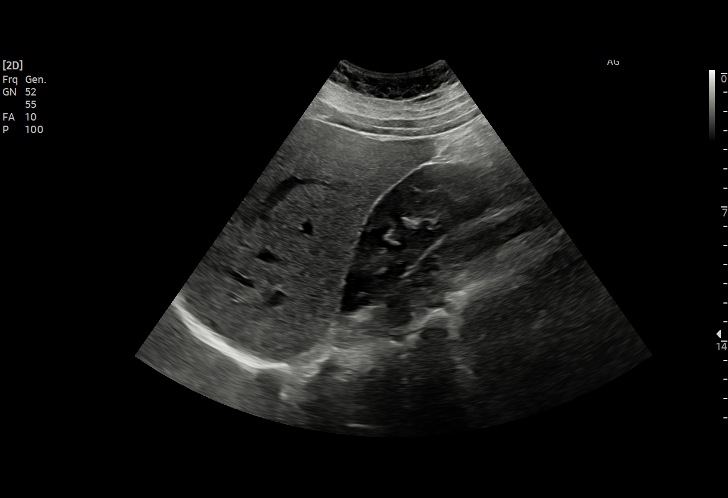

[15 of 25 positions shown; findings below may reference images not displayed]

FINDINGS: Gallbladder:

Multiple shadowing renal calculi measuring up to 1.1 cm. No
gallbladder wall thickening. No sonographic Murphy sign noted by
sonographer.

Common bile duct:

Diameter: 0.3 cm, within normal limits

Liver:

No focal lesion identified. Slight increase in liver parenchymal
echogenicity. Portal vein is patent on color Doppler imaging with
normal direction of blood flow towards the liver.

Other: None.
IMPRESSION: 1.  Cholelithiasis without evidence of acute cholecystitis.

2. Slight increase in liver parenchymal echogenicity which is
nonspecific but may represent early fatty infiltration.

## 2022-07-03 ENCOUNTER — Emergency Department
Admission: EM | Admit: 2022-07-03 | Discharge: 2022-07-03 | Disposition: A | Discharge status: Home / Self Care | Payer: Medicaid Other | Attending: Emergency Medicine | Admitting: Emergency Medicine

## 2022-07-03 ENCOUNTER — Encounter: Payer: Self-pay | Admitting: Emergency Medicine

## 2022-07-03 ENCOUNTER — Other Ambulatory Visit: Payer: Self-pay

## 2022-07-03 DIAGNOSIS — R112 Nausea with vomiting, unspecified: Secondary | ICD-10-CM | POA: Insufficient documentation

## 2022-07-03 DIAGNOSIS — R197 Diarrhea, unspecified: Secondary | ICD-10-CM | POA: Diagnosis not present

## 2022-07-03 LAB — CBC
HCT: 41.6 % (ref 36.0–46.0)
Hemoglobin: 13.1 g/dL (ref 12.0–15.0)
MCH: 26.2 pg (ref 26.0–34.0)
MCHC: 31.5 g/dL (ref 30.0–36.0)
MCV: 83.2 fL (ref 80.0–100.0)
Platelets: 305 10*3/uL (ref 150–400)
RBC: 5 MIL/uL (ref 3.87–5.11)
RDW: 14.5 % (ref 11.5–15.5)
WBC: 9.1 10*3/uL (ref 4.0–10.5)
nRBC: 0 % (ref 0.0–0.2)

## 2022-07-03 LAB — COMPREHENSIVE METABOLIC PANEL
ALT: 118 U/L — ABNORMAL HIGH (ref 0–44)
AST: 65 U/L — ABNORMAL HIGH (ref 15–41)
Albumin: 3.5 g/dL (ref 3.5–5.0)
Alkaline Phosphatase: 85 U/L (ref 38–126)
Anion gap: 8 (ref 5–15)
BUN: 10 mg/dL (ref 6–20)
CO2: 22 mmol/L (ref 22–32)
Calcium: 8.9 mg/dL (ref 8.9–10.3)
Chloride: 109 mmol/L (ref 98–111)
Creatinine, Ser: 0.93 mg/dL (ref 0.44–1.00)
GFR, Estimated: >60 mL/min (ref >60)
Glucose, Bld: 104 mg/dL — ABNORMAL HIGH (ref 70–99)
Potassium: 3.9 mmol/L (ref 3.5–5.1)
Sodium: 139 mmol/L (ref 135–145)
Total Bilirubin: 0.6 mg/dL (ref 0.3–1.2)
Total Protein: 7.9 g/dL (ref 6.5–8.1)

## 2022-07-03 LAB — LIPASE, BLOOD: Lipase: 37 U/L (ref 11–51)

## 2022-07-03 MED ORDER — ONDANSETRON 4 MG PO TBDP
4.0000 mg | ORAL_TABLET | Freq: Once | ORAL | Status: AC
Start: 2022-07-03 — End: 2022-07-03
  Administered 2022-07-03: 4 mg via ORAL
  Filled 2022-07-03: qty 1

## 2022-07-03 MED ORDER — ONDANSETRON 4 MG PO TBDP: 4.0000 mg | ORAL_TABLET | Freq: Three times a day (TID) | ORAL | 0 refills | Status: DC | PRN

## 2022-07-03 MED ORDER — ALUM & MAG HYDROXIDE-SIMETH 200-200-20 MG/5ML PO SUSP
30.0000 mL | Freq: Once | ORAL | Status: AC
  Administered 2022-07-03: 30 mL via ORAL
  Filled 2022-07-03: qty 30

## 2022-07-03 NOTE — ED Provider Notes (Signed)
[facility] Provider Note    Event Date/Time   First MD Initiated Contact with Patient 07/03/22 1356     (approximate)   History   Abdominal Pain   HPI  Victoria Elliott is a 36 y.o. female past medical history gallstones who presents with nausea vomiting diarrhea.  Symptoms started yesterday morning patient patient had multiple episodes of nausea vomiting as well as loose stool that she said looked dark but not black.  She denies frank abdominal pain says she was having cramping like he would typically have when you have diarrhea.  Has not had episode of diarrhea or vomiting today.  She has been now tolerating p.o. no fevers chills.  She went to urgent care they told her to come to the emergency department.  She supposed to have her gallbladder removed.  Also been told she has a fatty liver.  Patient denies abdominal pain currently.  Urgent care had negative pregnancy test.    Past Medical History:  Diagnosis Date   Gall stones     Patient Active Problem List   Diagnosis Date Noted   Abdominal pain, chronic, epigastric    Antral erythema    Vaginal bleeding 04/22/2021   Gestational hypertension 12/18/2020   Prediabetes 12/18/2020   Uterine size date discrepancy, third trimester 12/18/2020   Previous cesarean delivery affecting pregnancy 12/17/2020   Postoperative state 12/17/2020   Lower back pain 12/05/2020   Gastritis 08/29/2020   Esophageal reflux 03/29/2014     Physical Exam  Triage Vital Signs: ED Triage Vitals  Enc Vitals Group     BP 07/03/22 1200 (!) 142/88     Pulse Rate 07/03/22 1200 71     Resp 07/03/22 1200 16     Temp 07/03/22 1200 98.4 F (36.9 C)     Temp Source 07/03/22 1200 Oral     SpO2 07/03/22 1200 95 %     Weight 07/03/22 1200 234 lb (106.1 kg)     Height 07/03/22 1200 5\' 4"  (1.626 m)     Head Circumference --      Peak Flow --      Pain Score 07/03/22 1204 0     Pain Loc --      Pain Edu? --      Excl. in GC? --      Most recent vital signs: Vitals:   07/03/22 1200  BP: (!) 142/88  Pulse: 71  Resp: 16  Temp: 98.4 F (36.9 C)  SpO2: 95%     General: Awake, no distress.  CV:  Good peripheral perfusion.  Resp:  Normal effort.  Abd:  No distention.  Obese abdomen, soft nontender throughout, specifically no right upper quadrant tenderness Neuro:             Awake, Alert, Oriented x 3  Other:     ED Results / Procedures / Treatments  Labs (all labs ordered are listed, but only abnormal results are displayed) Labs Reviewed  COMPREHENSIVE METABOLIC PANEL - Abnormal; Notable for the following components:      Result Value   Glucose, Bld 104 (*)    AST 65 (*)    ALT 118 (*)    All other components within normal limits  LIPASE, BLOOD  CBC  URINALYSIS, ROUTINE W REFLEX MICROSCOPIC  POC URINE PREG, ED     EKG    RADIOLOGY    PROCEDURES:  Critical Care performed: No  Procedures   MEDICATIONS ORDERED IN ED: Medications  alum & mag hydroxide-simeth (MAALOX/MYLANTA) 200-200-20 MG/5ML suspension 30 mL (has no administration in time range)  ondansetron (ZOFRAN-ODT) disintegrating tablet 4 mg (has no administration in time range)     IMPRESSION / MDM / ASSESSMENT AND PLAN / ED COURSE  I reviewed the triage vital signs and the nursing notes.                              Patient's presentation is most consistent with acute complicated illness / injury requiring diagnostic workup.  Differential diagnosis includes, but is not limited to, gastroenteritis, pancreatitis, biliary colic, cholecystitis, peptic ulcer  Patient is a 36 year old female who has history of gallstones presents with nausea vomiting and diarrhea that mainly occurred yesterday.  She was concerned because she had some dark stools and went to urgent care today for evaluation they told her to come to the emergency department.  She denies really having abdominal pain says she was having cramping yesterday but this  is resolved.  Has not vomited or had diarrhea today.  She denies abdominal pain currently and has Doritos and Pepsi at bedside which she is tolerating.  On exam she has no tenderness in the abdomen specifically no right upper quadrant tenderness.  Labs show normal hemoglobin and normal BUN.  AST ALT mildly elevated.  She tells me she has a history of fatty liver which could be causing this elevation.  I have low suspicion for cholecystitis at this time.  Bilirubin is normal.  Given she is having no abdominal pain do not think she needs imaging at this time.  We discussed her elevated liver enzymes.  Patient given Zofran and GI cocktail.  We discussed return precautions she is appropriate for discharge.       FINAL CLINICAL IMPRESSION(S) / ED DIAGNOSES   Final diagnoses:  Nausea vomiting and diarrhea     Rx / DC Orders   ED Discharge Orders          Ordered    ondansetron (ZOFRAN-ODT) 4 MG disintegrating tablet  Every 8 hours PRN        07/03/22 1436             Note:  This document was prepared using Dragon voice recognition software and may include unintentional dictation errors.   Georga Hacking, MD 07/03/22 [phone]

## 2022-07-03 NOTE — Discharge Instructions (Addendum)
You likely have a viral illness is causing the nausea vomiting and diarrhea.  You can take the Zofran as needed for nausea.  If you develop worsening abdominal pain please return to the emergency department.  Your liver function test were mildly elevated.  Please follow-up with your primary doctor to have these repeated to ensure they are not rising.

## 2022-07-03 NOTE — ED Triage Notes (Signed)
Patient sent from [facility] for further evaluation for mid abdominal pain and dark stools yesterday. Pain has resolved since then. Had urine and flu test done PTA.

## 2022-07-15 ENCOUNTER — Institutional Professional Consult (permissible substitution): Payer: Medicaid Other | Admitting: Plastic Surgery

## 2022-08-05 ENCOUNTER — Institutional Professional Consult (permissible substitution): Payer: Medicaid Other | Admitting: Plastic Surgery

## 2022-08-28 ENCOUNTER — Ambulatory Visit (INDEPENDENT_AMBULATORY_CARE_PROVIDER_SITE_OTHER): Payer: 59 | Admitting: Plastic Surgery

## 2022-08-28 ENCOUNTER — Encounter: Payer: Self-pay | Admitting: Plastic Surgery

## 2022-08-28 VITALS — BP 124/82 | HR 70 | Ht 64.0 in | Wt 246.8 lb

## 2022-08-28 DIAGNOSIS — Z6841 Body Mass Index (BMI) 40.0 and over, adult: Secondary | ICD-10-CM | POA: Diagnosis not present

## 2022-08-28 DIAGNOSIS — N62 Hypertrophy of breast: Secondary | ICD-10-CM | POA: Diagnosis not present

## 2022-08-28 DIAGNOSIS — M546 Pain in thoracic spine: Secondary | ICD-10-CM | POA: Diagnosis not present

## 2022-08-28 DIAGNOSIS — M542 Cervicalgia: Secondary | ICD-10-CM

## 2022-08-28 DIAGNOSIS — M25519 Pain in unspecified shoulder: Secondary | ICD-10-CM

## 2022-08-28 NOTE — Progress Notes (Signed)
Referring Provider Shari Prows, [facility] [address] [place],  [place] [address]   CC:  Chief Complaint  Patient presents with   Advice Only      Victoria Elliott is an 37 y.o. female.  HPI: Victoria Elliott is a very nice 37 year old female presents today with complaints of upper back and neck pain and bra straps digging into her shoulders due to the large size of her breast.  She is requesting a breast reduction.  She states she has had no specific problems with the breast themselves but that the size especially since the birth of her last child causes her significant discomfort.  No Known Allergies  Outpatient Encounter Medications as of 08/28/2022  Medication Sig   citalopram (CELEXA) 40 MG tablet Take by mouth.   FEROSUL 325 (65 Fe) MG tablet Take 325 mg by mouth every morning.   venlafaxine XR (EFFEXOR-XR) 37.5 MG 24 hr capsule Take by mouth.   [DISCONTINUED] calcium carbonate (TUMS - DOSED IN MG ELEMENTAL CALCIUM) 500 MG chewable tablet Chew 1 tablet by mouth daily as needed for indigestion or heartburn. (Patient not taking: Reported on 08/28/2022)   [DISCONTINUED] dicyclomine (BENTYL) 10 MG capsule Take 2 capsules (20 mg total) by mouth 4 (four) times daily -  before meals and at bedtime.   [DISCONTINUED] NIFEdipine (ADALAT CC) 30 MG 24 hr tablet Take 1 tablet (30 mg total) by mouth daily. (Patient not taking: Reported on 08/28/2022)   [DISCONTINUED] norgestimate-ethinyl estradiol (ORTHO-CYCLEN) 0.25-35 MG-MCG tablet PLEASE SEE ATTACHED FOR DETAILED DIRECTIONS (Patient not taking: Reported on 08/28/2022)   [DISCONTINUED] norgestimate-ethinyl estradiol (ORTHO-CYCLEN) 0.25-35 MG-MCG tablet Take by mouth. (Patient not taking: Reported on 08/28/2022)   [DISCONTINUED] ondansetron (ZOFRAN-ODT) 4 MG disintegrating tablet Take 1 tablet (4 mg total) by mouth every 8 (eight) hours as needed. (Patient not taking: Reported on 08/28/2022)   No facility-administered encounter medications on file as  of 08/28/2022.     Past Medical History:  Diagnosis Date   Gall stones     Past Surgical History:  Procedure Laterality Date   CESAREAN SECTION  04/22/2013   CESAREAN SECTION  12/17/2020   Procedure: CESAREAN SECTION;  Surgeon: Schermerhorn, Gwen Her, MD;  Location: [facility] ORS;  Service: Obstetrics;;   DILATION AND CURETTAGE OF UTERUS     DILATION AND EVACUATION N/A 09/25/2017   Procedure: DILATATION AND EVACUATION;  Surgeon: Benjaman Kindler, MD;  Location: [facility] ORS;  Service: Gynecology;  Laterality: N/A;   ESOPHAGOGASTRODUODENOSCOPY (EGD) WITH PROPOFOL N/A 07/31/2021   Procedure: ESOPHAGOGASTRODUODENOSCOPY (EGD) WITH PROPOFOL;  Surgeon: Lin Landsman, MD;  Location: [facility] ENDOSCOPY;  Service: Gastroenterology;  Laterality: N/A;    No family history on file.  Social History   Social History Narrative   Not on file     Review of Systems General: Denies fevers, chills, weight loss CV: Denies chest pain, shortness of breath, palpitations Breast: No past breast history no breast disease.  Large breast causing upper back and neck pain.  Physical Exam    08/28/2022    9:55 AM 07/03/2022    2:55 PM 07/03/2022   12:00 PM  Vitals with BMI  Height 5' 4"$   5' 4"$   Weight 246 lbs 13 oz  234 lbs  BMI 123456  [id number]  Systolic [id number]  Diastolic 82 80 88  Pulse 70 70 71    General:  No acute distress,  Alert and oriented, Non-Toxic, Normal speech and affect Breast: Patient has very large very heavy  pendulous breasts.  She does not have any masses on exam the nipples are normal in appearance and there is no nipple discharge. Mammogram: Patient is 37 years old and has not begun mammographic screening Assessment/Plan Symptomatic macromastia: The patient is an acceptable candidate for breast reduction.  She is in general larger than I would prefer to operate on with a BMI of 42.4.  We have discussed that believe this puts her at a higher risk for complications from the surgery and a  higher risk of complications for the breast reduction.  I have asked her to work on weight loss while she is being scheduled for surgery.  Referrals been made to the healthy weight and wellness program as well as physical therapy.  We discussed the procedure at length including the location of the incisions and the unpredictable nature of scarring.  We discussed the risks of bleeding, infection, and seroma formation.  She understands I will use drains postoperatively to help decrease the risk of seroma formation.  We discussed the risk of nipple loss due to nipple ischemia.  She understands that she is at a higher risk for this due to her size.  We discussed the postoperative physical limitations of no heavy lifting greater than 20 pounds, no vigorous activity, and no submerging the incisions in water for 3 weeks.  The patient works as a [employer] fundraiser and she may return to work as soon as she feels comfortable however I believe this will be at least a week postoperatively.  She is encouraged to begin ambulation immediately after surgery to help decrease the risk of DVT.  All questions were answered to her satisfaction.  Photographs were taken with her permission.  I believe I can remove 800 to 900 g per breast.  [name] 08/28/2022, 11:52 AM

## 2022-09-10 ENCOUNTER — Encounter: Payer: Medicaid Other | Admitting: Bariatrics

## 2022-09-10 ENCOUNTER — Ambulatory Visit: Payer: 59 | Attending: Plastic Surgery | Admitting: Physical Therapy

## 2022-09-10 ENCOUNTER — Encounter: Payer: Self-pay | Admitting: Physical Therapy

## 2022-09-10 DIAGNOSIS — N62 Hypertrophy of breast: Secondary | ICD-10-CM | POA: Insufficient documentation

## 2022-09-10 DIAGNOSIS — G8929 Other chronic pain: Secondary | ICD-10-CM | POA: Diagnosis present

## 2022-09-10 DIAGNOSIS — M25519 Pain in unspecified shoulder: Secondary | ICD-10-CM | POA: Insufficient documentation

## 2022-09-10 DIAGNOSIS — M25512 Pain in left shoulder: Secondary | ICD-10-CM | POA: Insufficient documentation

## 2022-09-10 DIAGNOSIS — M6281 Muscle weakness (generalized): Secondary | ICD-10-CM | POA: Diagnosis present

## 2022-09-10 DIAGNOSIS — M542 Cervicalgia: Secondary | ICD-10-CM | POA: Diagnosis present

## 2022-09-10 NOTE — Therapy (Signed)
OUTPATIENT PHYSICAL THERAPY CERVICOTHORACIC EVALUATION   Patient Name: Victoria Elliott MRN: [id number] DOB:1985-10-05, 37 y.o., female Today's Date: 09/10/2022   PT End of Session - 09/10/22 1731     Visit Number 1    Number of Visits 13    Date for PT Re-Evaluation 10/22/22    Authorization Type Medicaid [employer]    Authorization Time Period Auth not req'd    Progress Note Due on Visit 10    PT Start Time 1630    PT Stop Time 1717    PT Time Calculation (min) 47 min    Activity Tolerance Patient tolerated treatment well    Behavior During Therapy WFL for tasks assessed/performed             Past Medical History:  Diagnosis Date   Gall stones    Past Surgical History:  Procedure Laterality Date   CESAREAN SECTION  04/22/2013   CESAREAN SECTION  12/17/2020   Procedure: CESAREAN SECTION;  Surgeon: Schermerhorn, Ihor Austin, MD;  Location: [facility] ORS;  Service: Obstetrics;;   DILATION AND CURETTAGE OF UTERUS     DILATION AND EVACUATION N/A 09/25/2017   Procedure: DILATATION AND EVACUATION;  Surgeon: Christeen Douglas, MD;  Location: [facility] ORS;  Service: Gynecology;  Laterality: N/A;   ESOPHAGOGASTRODUODENOSCOPY (EGD) WITH PROPOFOL N/A 07/31/2021   Procedure: ESOPHAGOGASTRODUODENOSCOPY (EGD) WITH PROPOFOL;  Surgeon: Toney Reil, MD;  Location: [facility] ENDOSCOPY;  Service: Gastroenterology;  Laterality: N/A;   Patient Active Problem List   Diagnosis Date Noted   Abdominal pain, chronic, epigastric    Antral erythema    Vaginal bleeding 04/22/2021   Gestational hypertension 12/18/2020   Prediabetes 12/18/2020   Uterine size date discrepancy, third trimester 12/18/2020   Previous cesarean delivery affecting pregnancy 12/17/2020   Postoperative state 12/17/2020   Lower back pain 12/05/2020   Gastritis 08/29/2020   Esophageal reflux 03/29/2014    PCP: [facility]  REFERRING PROVIDER: Santiago Glad, MD  REFERRING DIAGNOSIS:  N62 (ICD-10-CM) -  Breast hypertrophy  M54.2,M25.519 (ICD-10-CM) - Neck and shoulder pain    THERAPY DIAG: Cervicalgia  Chronic left shoulder pain  Muscle weakness (generalized)  RATIONALE FOR EVALUATION AND TREATMENT: Rehabilitation  ONSET DATE: June 2022 (birth of second child)   FOLLOW UP APPT WITH PROVIDER: No , pt to f/u after PT    SUBJECTIVE:                                                                                                                                                                                         Chief Complaint:   Pt is a 37 year old female considering breast  reduction surgery with primary complaint of L>R periscapular/paracervical pain.   Pertinent History Pt is a 37 year old female considering breast reduction surgery with primary complaint of L>R periscapular/paracervical pain. Pt reports worsening pain in shoulders/traps and into paracervical region following having her second child. She reports losing circulation into her hands. Patient reports pain into shoulders and back. She reports OTC medications do not help. Patient reports some tingling into her hands intermittently. She reports difficulty with moving her shoulder at times. Pt reports pain along L periscapular region mainly recently, though she has experienced pain on R and L. Patient reports disturbed sleep and waking up in middle of night. She states that position change can help sometimes. Pt denies bowel/bladder changes. No sudden weight change. Pt manages depression and anxiety with medications.   Pain:  Pain Intensity: Present: 8-9/10, Best: 1/10, Worst: 10/10 Pain location: L>R periscapular region, lower cervical Pain Quality: aching  Radiating: No  Numbness/Tingling: Yes Focal Weakness: No Aggravating factors: sometimes pain without specific trigger, leaning onto L arm Relieving factors: essential oil topical treatment 24-hour pain behavior: None History of prior neck injury, pain, surgery, or  therapy: No Falls: Has patient fallen in last 6 months? 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  Recent memory is intact.  Remote memory is intact.  Attention span and concentration are intact.  Expressive speech is intact.  Patient's fund of knowledge is within normal limits for educational level.    Gross Musculoskeletal Assessment Tremor: None Bulk: Normal Tone: Normal   Posture Rounded shoulders posture at rest, anteriorly tilted scapulae   AROM  Shoulder AROM Flexion WNL, pain at end-range Abduction WNL, pain in mid-range  AROM (Normal range in degrees) AROM 09/10/2022  Cervical  Flexion (50) 44*  Extension (80) 52* at end-range  Right lateral flexion (45) 42  Left lateral flexion (45) 45  Right rotation (85) 72  Left rotation (85) 71*  (* = pain; Blank rows = not tested)   MMT MMT (out of 5) Right 09/10/2022 Left 09/10/2022      Shoulder   Flexion    Extension 5 4  Abduction 5 4*  Internal rotation    Horizontal abduction    Horizontal adduction    Lower Trapezius    Rhomboids        Elbow  Flexion 5 5  Extension 5 5  Pronation    Supination        Wrist  Flexion 5 5  Extension 5 5  Radial deviation    Ulnar deviation    Finger ABD           WNL          WNL  (* = pain; Blank rows = not tested)  Sensation Grossly intact to light touch bilateral UE as determined by testing dermatomes C2-T2. Proprioception and hot/cold testing deferred on  this date.  Reflexes R/L Elbow: 2+/2+  Brachioradialis: 2+/2+  Tricep: 2+/2+  Palpation Location LEFT  RIGHT           Suboccipitals    Cervical paraspinals 0 0  Upper Trapezius 1 0  Levator Scapulae 2 0  Rhomboid Major/Minor 2 0  (Blank rows = not tested) Graded on 0-4 scale (0 = no pain, 1 = pain, 2 = pain with wincing/grimacing/flinching, 3 = pain with withdrawal, 4 = unwilling to allow palpation), (Blank rows = not tested)   Repeated Movements Deferred today   Passive Accessory Intervertebral Motion Deferred    SPECIAL TESTS Spurlings A (ipsilateral lateral flexion/axial compression): R: Negative L: Negative Distraction Test: Positive  Hoffman Sign (cervical cord compression): R: Negative L: Negative ULTT Median: R: Not done L: Not done ULTT Ulnar: R: Not done L: Not done ULTT Radial: R: Not done L: Not done   TODAY'S TREATMENT    Therapeutic Exercise - for HEP establishment, discussion on appropriate exercise/activity modification, PT education   Reviewed baseline home exercises and provided handout for [employer] program (see Access Code); tactile cueing and therapist demonstration utilized as needed for carryover of proper technique to HEP.    Patient education on current condition, anatomy involved, prognosis, plan of care. Discussion on activity modification to prevent flare-up of condition, including proper sitting posture at work and ergonomics with sitting on stool and working with patients .     PATIENT EDUCATION:  Education details: Plan of care Person educated: Patient Education method: Explanation Education comprehension: verbalized understanding   HOME EXERCISE PROGRAM: Access Code: [email or web address] URL: [email or web address] Date:  09/10/2022 Prepared by: Consuela Mimes  Exercises - Seated Levator Scapulae Stretch  - 2 x daily - 7 x weekly - 3 sets - 30sec hold - Seated Scapular Retraction  - 2 x daily - 7 x weekly - 2 sets - 10 reps - 5sec hold - Seated Self Cervical Traction  - 2 x daily - 7 x weekly - 1-2 sets - 10 reps - 10sec hold   ASSESSMENT:  CLINICAL IMPRESSION: Patient is a 37 y.o. female who was seen today for physical therapy evaluation and treatment for neck/thoracic pain who is considering breast reduction surgery; she is undergoing trial of conservative management and PT prior to moving forward with surgery. She does not have signs of classic radiculopathy, though she may have moderate referred pain from cervical spine affecting L periscapular region. Red flag screen and UMN signs negative. Pt does have moderate postural changes, cervical spine AROM limitations, pain accessing end-range shoulder elevation, and pain with loading into shoulder flexion and abduction. Objective impairments include decreased ROM, decreased strength, hypomobility, impaired flexibility, postural dysfunction, and pain. These impairments are limiting patient from driving, community activity, childcare activities, and occupation. Personal factors including Past/current experiences, Time since onset of injury/illness/exacerbation, 1-2 comorbidities: obesity and GI issues, and chronicity of symptoms  are also affecting patient's functional outcome. Patient will benefit from skilled PT to address above impairments and improve overall function.  REHAB POTENTIAL: Good  CLINICAL DECISION MAKING: Evolving/moderate complexity  EVALUATION COMPLEXITY: Moderate   GOALS: Goals reviewed with patient? Yes  SHORT TERM GOALS: Target date: 10/01/2022  Pt will be independent with HEP to improve strength and decrease neck pain to improve pain-free function at home and work. Baseline: 09/10/22: Baseline HEP initiated  Goal status: INITIAL   LONG  TERM GOALS: Target date: 10/22/2022  Pt will increase FOTO to at least 60 to demonstrate significant improvement  in function at home and work related to neck pain  Baseline: 09/10/22: 50 Goal status: INITIAL  2.  Pt will decrease worst neck pain by at least 2 points on the NPRS in order to demonstrate clinically significant reduction in neck pain. Baseline: 09/10/22: 10/10 at worst  Goal status: INITIAL  3.  Pt will demonstrate normal cervical spine AROM without reproduction of L-sided neck or periscapular pain as needed for scanning environment, overhead activity, and driving Baseline: 10/21/79: Moderate motion loss with extension and bilateral rotation; pain with flexion and L rotation.  Goal status: INITIAL  4.  Patient will sleep through the night without disturbance secondary to neck pain  Baseline: 09/10/22: Pt reports frequent disturbed sleep and difficulty getting to sleep  Goal status: INITIAL   PLAN: PT FREQUENCY: 2x/week  PT DURATION: 4-6 weeks  PLANNED INTERVENTIONS: Therapeutic exercises, Therapeutic activity, Neuromuscular re-education, Patient/Family education, Joint manipulation, Joint mobilization, Dry Needling, Electrical stimulation, Spinal manipulation, Spinal mobilization, Cryotherapy, Moist heat, Traction, and Manual therapy  PLAN FOR NEXT SESSION: Complete ULTT, continue with use of traction and symptom modulation strategies, STM. Check Adon's and Roo's tests. Consider dry needling. Peri-scapular strengthening and postural re-edu for ADLs and work    Consuela Mimes, PT, DPT #[id number]  Gertie Exon 09/10/2022, 5:32 PM

## 2022-10-01 ENCOUNTER — Ambulatory Visit: Payer: 59 | Attending: Plastic Surgery | Admitting: Physical Therapy

## 2022-10-01 DIAGNOSIS — M6281 Muscle weakness (generalized): Secondary | ICD-10-CM | POA: Diagnosis present

## 2022-10-01 DIAGNOSIS — M25512 Pain in left shoulder: Secondary | ICD-10-CM | POA: Diagnosis present

## 2022-10-01 DIAGNOSIS — G8929 Other chronic pain: Secondary | ICD-10-CM | POA: Diagnosis present

## 2022-10-01 DIAGNOSIS — M542 Cervicalgia: Secondary | ICD-10-CM | POA: Insufficient documentation

## 2022-10-01 NOTE — Therapy (Signed)
OUTPATIENT PHYSICAL THERAPY TREATMENT NOTE   Patient Name: Victoria Elliott MRN: [id number] DOB:[birth year], 37 y.o., female Today's Date: 10/01/2022  PCP: [facility], [place] REFERRING PROVIDER: [name], MD   END OF SESSION:   PT End of Session - 10/06/22 0600     Visit Number 2    Number of Visits 13    Date for PT Re-Evaluation 10/22/22    Authorization Type Medicaid [employer]    Authorization Time Period Auth not req'd    Progress Note Due on Visit 10    PT Start Time [id number]    PT Stop Time 1630    PT Time Calculation (min) 45 min    Activity Tolerance Patient tolerated treatment well    Behavior During Therapy [facility] for tasks assessed/performed             Past Medical History:  Diagnosis Date   Gall stones    Past Surgical History:  Procedure Laterality Date   CESAREAN SECTION  04/22/2013   CESAREAN SECTION  12/17/2020   Procedure: CESAREAN SECTION;  Surgeon: Schermerhorn, Gwen Her, MD;  Location: [facility] ORS;  Service: Obstetrics;;   DILATION AND CURETTAGE OF UTERUS     DILATION AND EVACUATION N/A 09/25/2017   Procedure: DILATATION AND EVACUATION;  Surgeon: Benjaman Kindler, MD;  Location: [facility] ORS;  Service: Gynecology;  Laterality: N/A;   ESOPHAGOGASTRODUODENOSCOPY (EGD) WITH PROPOFOL N/A 07/31/2021   Procedure: ESOPHAGOGASTRODUODENOSCOPY (EGD) WITH PROPOFOL;  Surgeon: Lin Landsman, MD;  Location: [facility] ENDOSCOPY;  Service: Gastroenterology;  Laterality: N/A;   Patient Active Problem List   Diagnosis Date Noted   Abdominal pain, chronic, epigastric    Antral erythema    Vaginal bleeding 04/22/2021   Gestational hypertension 12/18/2020   Prediabetes 12/18/2020   Uterine size date discrepancy, third trimester 12/18/2020   Previous cesarean delivery affecting pregnancy 12/17/2020   Postoperative state 12/17/2020   Lower back pain 12/05/2020   Gastritis 08/29/2020   Esophageal reflux 03/29/2014    REFERRING DIAG:  N62 (ICD-10-CM) -  Breast hypertrophy  M54.2,M25.519 (ICD-10-CM) - Neck and shoulder pain    THERAPY DIAG:  Cervicalgia  Chronic left shoulder pain  Muscle weakness (generalized)  Rationale for Evaluation and Treatment Rehabilitation  PERTINENT HISTORY: Pt is a 37 year old female considering breast reduction surgery with primary complaint of L>R periscapular/paracervical pain. Pt reports worsening pain in shoulders/traps and into paracervical region following having her second child. She reports losing circulation into her hands. Patient reports pain into shoulders and back. She reports OTC medications do not help. Patient reports some tingling into her hands intermittently. She reports difficulty with moving her shoulder at times. Pt reports pain along L periscapular region mainly recently, though she has experienced pain on R and L. Patient reports disturbed sleep and waking up in middle of night. She states that position change can help sometimes. Pt denies bowel/bladder changes. No sudden weight change. Pt manages depression and anxiety with medications.    Pain:  Pain Intensity: Present: 8-9/10, Best: 1/10, Worst: 10/10 Pain location: L>R periscapular region, lower cervical Pain Quality: aching  Radiating: No  Numbness/Tingling: Yes Focal Weakness: No Aggravating factors: sometimes pain without specific trigger, leaning onto L arm Relieving factors: essential oil topical treatment 24-hour pain behavior: None History of prior neck injury, pain, surgery, or therapy: No Falls: Has patient fallen in last 6 months? No, Number of falls: N/A Follow-up appointment with MD: No Dominant hand: right Imaging: No Prior level of function: Independent Occupational demands:  Pt is phlebotomist  Hobbies: travel, outdoor activities  Red flags (personal history of cancer, h/o spinal tumors, history of compression fracture, chills/fever, night sweats, nausea, vomiting, unrelenting pain): Negative   Weight Bearing  Restrictions: No   Living Environment Lives with: lives with their son and lives with their daughter Lives in: House/apartment     Patient Goals: Pt is coming here prior to considering breast reduction surgery     PRECAUTIONS: None    SUBJECTIVE:                                                                                                                                                                                      SUBJECTIVE STATEMENT:  Patient reports working on her exercises intermittently, "when I had time to get to them." Patient reports pain is not as bad now given that she has had a slow day at work. She reports pain along L upper trap area mainly and along low back recently. Minimal symptoms at arrival this afternoon. Pt will be following up with plastic surgeon following completion of PT.    PAIN:  Are you having pain? No pain at arrival Pain location: L UT   OBJECTIVE: (objective measures completed at initial evaluation unless otherwise dated)   Patient Surveys  FOTO 50, predicted outcome score of 60   Cognition Patient is oriented to person, place, and time.  Recent memory is intact.  Remote memory is intact.  Attention span and concentration are intact.  Expressive speech is intact.  Patient's fund of knowledge is within normal limits for educational level.                          Gross Musculoskeletal Assessment Tremor: None Bulk: Normal Tone: Normal     Posture Rounded shoulders posture at rest, anteriorly tilted scapulae     AROM   Shoulder AROM Flexion WNL, pain at end-range Abduction WNL, pain in mid-range   AROM (Normal range in degrees) AROM 09/10/2022  Cervical  Flexion (50) 44*  Extension (80) 52* at end-range  Right lateral flexion (45) 42  Left lateral flexion (45) 45  Right rotation (85) 72  Left rotation (85) 71*  (* = pain; Blank rows = not tested)     MMT MMT (out of 5) Right 09/10/2022 Left 09/10/2022          Shoulder   Flexion      Extension 5 4  Abduction 5 4*  Internal rotation      Horizontal abduction      Horizontal adduction      Lower Trapezius  Rhomboids             Elbow  Flexion 5 5  Extension 5 5  Pronation      Supination             Wrist  Flexion 5 5  Extension 5 5  Radial deviation      Ulnar deviation      Finger ABD          WNL          WNL  (* = pain; Blank rows = not tested)   Sensation Grossly intact to light touch bilateral UE as determined by testing dermatomes C2-T2. Proprioception and hot/cold testing deferred on this date.   Reflexes R/L Elbow: 2+/2+  Brachioradialis: 2+/2+  Tricep: 2+/2+   Palpation Location LEFT  RIGHT           Suboccipitals      Cervical paraspinals 0 0  Upper Trapezius 1 0  Levator Scapulae 2 0  Rhomboid Major/Minor 2 0  (Blank rows = not tested) Graded on 0-4 scale (0 = no pain, 1 = pain, 2 = pain with wincing/grimacing/flinching, 3 = pain with withdrawal, 4 = unwilling to allow palpation), (Blank rows = not tested)     Repeated Movements Deferred today    Passive Accessory Intervertebral Motion Deferred     SPECIAL TESTS Spurlings A (ipsilateral lateral flexion/axial compression): R: Negative L: Negative Distraction Test: Positive  Hoffman Sign (cervical cord compression): R: Negative L: Negative ULTT Median: R: Not done L: Negative  ULTT Ulnar: R: Not done L: Negative  ULTT Radial: R: Not done L: Negative    TOS Adon Test: Negative  Roo's Test: Negative  Wright Test: Negative      TODAY'S TREATMENT      OBJECTIVE FINDINGS  AROM Cervical flexion:  WNL Cervical extension: min motion loss Lateral flexion: Right WNL , Left WNL Cervical rotation: Right WNL, Left WNL *Indicates pain   Manual Therapy - for symptom modulation, soft tissue sensitivity and mobility, joint mobility, ROM   STM/DTM and IASTM with Hypervolt L>R levator scapulae, L>R middle trapezius/rhomboid mm; x 15  minutes  T1-T10 CPA: springy end-feel without notable reproduction of symptoms   Therapeutic Exercise - for improved soft tissue flexibility and extensibility as needed for ROM, postural re-edu and periscapular strengthening to improve tolerance of sustained upright activity and clinical work  Upper trapezius stretch, reviewed Levator scapulae stretch, reviewed Prone T; 2x10 Doorway pec stretch; initiated in 90/90 position; performed x 30 sec  -modified to 60 deg abduction in doorway to improve tolerance due to shoulder discomfort; 3x30sec Standing row with Black Tband; 2x10, 3 sec hold    PATIENT EDUCATION: HEP update and review. Provided Tband for home. Discussed potential use of other strategies for symptom modulation as needed if pt is experiencing significant symptoms.         PATIENT EDUCATION:  Education details: Plan of care Person educated: Patient Education method: Explanation Education comprehension: verbalized understanding     HOME EXERCISE PROGRAM: Access Code: [email or web address] URL: [email or web address] Date: 09/10/2022 Prepared by: Valentina Gu   Exercises - Seated Levator Scapulae Stretch  - 2 x daily - 7 x weekly - 3 sets - 30sec hold - Seated Scapular Retraction  - 2 x daily - 7 x weekly - 2 sets - 10 reps - 5sec hold - Seated Self Cervical Traction  - 2 x daily - 7 x weekly - 1-2 sets - 10 reps - 10sec  hold     ASSESSMENT:   CLINICAL IMPRESSION: Patient arrives with excellent motivation to participate in physical therapy. She presents with good cervical spine AROM without notable pain this evening. She does have intermittent flare-ups of cervicothoracic pain with work and intermittent low back pain as well. We progressed with posural re-edu and periscapular strengthening today with pt tolerating these activities well. She does have discomfort with 90/90 position for pec stretching - we used modified version (60 deg abduction with doorway pec  stretch) and this was better tolerated. Pt has mild sensitivity affecting R levator scapulae/rhomboid musculature and has good response with soft tissue mobilization/trigger point release today. Patient has remaining deficits in moderate postural changes, cervical spine AROM limitations, pain accessing end-range shoulder elevation, and pain with loading into shoulder flexion and abduction. Patient will benefit from continued skilled therapeutic intervention to address the above deficits as needed for improved function and QoL.     REHAB POTENTIAL: Good   CLINICAL DECISION MAKING: Evolving/moderate complexity   EVALUATION COMPLEXITY: Moderate     GOALS: Goals reviewed with patient? Yes   SHORT TERM GOALS: Target date: 10/01/2022   Pt will be independent with HEP to improve strength and decrease neck pain to improve pain-free function at home and work. Baseline: 09/10/22: Baseline HEP initiated  Goal status: INITIAL     LONG TERM GOALS: Target date: 10/22/2022   Pt will increase FOTO to at least 60 to demonstrate significant improvement in function at home and work related to neck pain  Baseline: 09/10/22: 50 Goal status: INITIAL   2.  Pt will decrease worst neck pain by at least 2 points on the NPRS in order to demonstrate clinically significant reduction in neck pain. Baseline: 09/10/22: 10/10 at worst  Goal status: INITIAL   3.  Pt will demonstrate normal cervical spine AROM without reproduction of L-sided neck or periscapular pain as needed for scanning environment, overhead activity, and driving Baseline: [id number]: Moderate motion loss with extension and bilateral rotation; pain with flexion and L rotation.  Goal status: INITIAL   4.  Patient will sleep through the night without disturbance secondary to neck pain  Baseline: 09/10/22: Pt reports frequent disturbed sleep and difficulty getting to sleep  Goal status: INITIAL     PLAN: PT FREQUENCY: 2x/week   PT DURATION: 4-6  weeks   PLANNED INTERVENTIONS: Therapeutic exercises, Therapeutic activity, Neuromuscular re-education, Patient/Family education, Joint manipulation, Joint mobilization, Dry Needling, Electrical stimulation, Spinal manipulation, Spinal mobilization, Cryotherapy, Moist heat, Traction, and Manual therapy   PLAN FOR NEXT SESSION: Continue with use of traction and symptom modulation strategies, STM. Consider dry needling. Peri-scapular strengthening and postural re-edu for ADLs and work       Valentina Gu, PT, DPT [id number]  Eilleen Kempf, PT 10/06/2022, 6:01 AM

## 2022-10-06 ENCOUNTER — Encounter: Payer: Self-pay | Admitting: Physical Therapy

## 2022-10-07 ENCOUNTER — Ambulatory Visit: Payer: 59 | Admitting: Physical Therapy

## 2022-10-07 ENCOUNTER — Telehealth: Payer: Self-pay | Admitting: Plastic Surgery

## 2022-10-07 DIAGNOSIS — M542 Cervicalgia: Secondary | ICD-10-CM

## 2022-10-07 DIAGNOSIS — G8929 Other chronic pain: Secondary | ICD-10-CM

## 2022-10-07 DIAGNOSIS — M6281 Muscle weakness (generalized): Secondary | ICD-10-CM

## 2022-10-07 NOTE — Therapy (Signed)
OUTPATIENT PHYSICAL THERAPY TREATMENT NOTE   Patient Name: Victoria Elliott MRN: [id number] DOB:September 20, 1985, 37 y.o., female Today's Date: 10/07/2022  PCP: [facility], [place] REFERRING PROVIDER: [name], MD   END OF SESSION:   PT End of Session - 10/07/22 1516     Visit Number 3    Number of Visits 13    Date for PT Re-Evaluation 10/22/22    Authorization Type Medicaid [employer]    Authorization Time Period Auth not req'd    Progress Note Due on Visit 10    PT Start Time 1516    PT Stop Time 1559    PT Time Calculation (min) 43 min    Activity Tolerance Patient tolerated treatment well    Behavior During Therapy [facility] for tasks assessed/performed              Past Medical History:  Diagnosis Date   Gall stones    Past Surgical History:  Procedure Laterality Date   CESAREAN SECTION  04/22/2013   CESAREAN SECTION  12/17/2020   Procedure: CESAREAN SECTION;  Surgeon: Schermerhorn, Gwen Her, MD;  Location: [facility] ORS;  Service: Obstetrics;;   DILATION AND CURETTAGE OF UTERUS     DILATION AND EVACUATION N/A 09/25/2017   Procedure: DILATATION AND EVACUATION;  Surgeon: Benjaman Kindler, MD;  Location: [facility] ORS;  Service: Gynecology;  Laterality: N/A;   ESOPHAGOGASTRODUODENOSCOPY (EGD) WITH PROPOFOL N/A 07/31/2021   Procedure: ESOPHAGOGASTRODUODENOSCOPY (EGD) WITH PROPOFOL;  Surgeon: Lin Landsman, MD;  Location: [facility] ENDOSCOPY;  Service: Gastroenterology;  Laterality: N/A;   Patient Active Problem List   Diagnosis Date Noted   Abdominal pain, chronic, epigastric    Antral erythema    Vaginal bleeding 04/22/2021   Gestational hypertension 12/18/2020   Prediabetes 12/18/2020   Uterine size date discrepancy, third trimester 12/18/2020   Previous cesarean delivery affecting pregnancy 12/17/2020   Postoperative state 12/17/2020   Lower back pain 12/05/2020   Gastritis 08/29/2020   Esophageal reflux 03/29/2014    REFERRING DIAG:  N62 (ICD-10-CM) -  Breast hypertrophy  M54.2,M25.519 (ICD-10-CM) - Neck and shoulder pain    THERAPY DIAG:  Cervicalgia  Chronic left shoulder pain  Muscle weakness (generalized)  Rationale for Evaluation and Treatment Rehabilitation  PERTINENT HISTORY: Pt is a 37 year old female considering breast reduction surgery with primary complaint of L>R periscapular/paracervical pain. Pt reports worsening pain in shoulders/traps and into paracervical region following having her second child. She reports losing circulation into her hands. Patient reports pain into shoulders and back. She reports OTC medications do not help. Patient reports some tingling into her hands intermittently. She reports difficulty with moving her shoulder at times. Pt reports pain along L periscapular region mainly recently, though she has experienced pain on R and L. Patient reports disturbed sleep and waking up in middle of night. She states that position change can help sometimes. Pt denies bowel/bladder changes. No sudden weight change. Pt manages depression and anxiety with medications.    Pain:  Pain Intensity: Present: 8-9/10, Best: 1/10, Worst: 10/10 Pain location: L>R periscapular region, lower cervical Pain Quality: aching  Radiating: No  Numbness/Tingling: Yes Focal Weakness: No Aggravating factors: sometimes pain without specific trigger, leaning onto L arm Relieving factors: essential oil topical treatment 24-hour pain behavior: None History of prior neck injury, pain, surgery, or therapy: No Falls: Has patient fallen in last 6 months? 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  Hobbies: travel, outdoor activities  Red flags (personal history of cancer, h/o spinal tumors, history of compression fracture, chills/fever, night sweats, nausea, vomiting, unrelenting pain): Negative   Weight Bearing  Restrictions: No   Living Environment Lives with: lives with their son and lives with their daughter Lives in: House/apartment     Patient Goals: Pt is coming here prior to considering breast reduction surgery     PRECAUTIONS: None    SUBJECTIVE:                                                                                                                                                                                      SUBJECTIVE STATEMENT:  Patient reports pain along L shoulder and low back over this past week. Patient reports minor pain in her shoulder at arrival to PT. Patient reports pain primarily affecting L periscapular region; pt denies pain affecting upper trap at arrival. Patient reports doing well with her HEP. Patient reports no specific aggravating factors. Pt reports getting adequate sleep.    PAIN:  Are you having pain? No pain at arrival Pain location: L UT   OBJECTIVE: (objective measures completed at initial evaluation unless otherwise dated)   Patient Surveys  FOTO 50, predicted outcome score of 60   Cognition Patient is oriented to person, place, and time.  Recent memory is intact.  Remote memory is intact.  Attention span and concentration are intact.  Expressive speech is intact.  Patient's fund of knowledge is within normal limits for educational level.                          Gross Musculoskeletal Assessment Tremor: None Bulk: Normal Tone: Normal     Posture Rounded shoulders posture at rest, anteriorly tilted scapulae     AROM   Shoulder AROM Flexion WNL, pain at end-range Abduction WNL, pain in mid-range   AROM (Normal range in degrees) AROM 09/10/2022  Cervical  Flexion (50) 44*  Extension (80) 52* at end-range  Right lateral flexion (45) 42  Left lateral flexion (45) 45  Right rotation (85) 72  Left rotation (85) 71*  (* = pain; Blank rows = not tested)     MMT MMT (out of 5) Right 09/10/2022 Left 09/10/2022          Shoulder   Flexion      Extension 5 4  Abduction 5 4*  Internal rotation      Horizontal abduction      Horizontal adduction      Lower Trapezius      Rhomboids  Elbow  Flexion 5 5  Extension 5 5  Pronation      Supination             Wrist  Flexion 5 5  Extension 5 5  Radial deviation      Ulnar deviation      Finger ABD          WNL          WNL  (* = pain; Blank rows = not tested)   Sensation Grossly intact to light touch bilateral UE as determined by testing dermatomes C2-T2. Proprioception and hot/cold testing deferred on this date.   Reflexes R/L Elbow: 2+/2+  Brachioradialis: 2+/2+  Tricep: 2+/2+   Palpation Location LEFT  RIGHT           Suboccipitals      Cervical paraspinals 0 0  Upper Trapezius 1 0  Levator Scapulae 2 0  Rhomboid Major/Minor 2 0  (Blank rows = not tested) Graded on 0-4 scale (0 = no pain, 1 = pain, 2 = pain with wincing/grimacing/flinching, 3 = pain with withdrawal, 4 = unwilling to allow palpation), (Blank rows = not tested)     Repeated Movements Deferred today    Passive Accessory Intervertebral Motion Deferred     SPECIAL TESTS Spurlings A (ipsilateral lateral flexion/axial compression): R: Negative L: Negative Distraction Test: Positive  Hoffman Sign (cervical cord compression): R: Negative L: Negative ULTT Median: R: Not done L: Negative  ULTT Ulnar: R: Not done L: Negative  ULTT Radial: R: Not done L: Negative    TOS Adon Test: Negative  Roo's Test: Negative  Wright Test: Negative      TODAY'S TREATMENT    Manual Therapy - for symptom modulation, soft tissue sensitivity and mobility, joint mobility, ROM   STM/DTM and IASTM with Hypervolt L>R levator scapulae, L>R middle trapezius/rhomboid mm; x 15 minutes  T1-T10 CPA: springy end-feel without notable reproduction of symptoms   Trigger Point Dry Needling (TDN), unbilled Education performed with patient regarding potential benefit of TDN.  Reviewed precautions and risks with patient. Reviewed special precautions/risks over lung fields which include pneumothorax. Reviewed signs and symptoms of pneumothorax and advised pt to go to ER immediately if these symptoms develop advise them of dry needling treatment. Extensive time spent with pt to ensure full understanding of TDN risks. Pt provided verbal consent to treatment. TDN performed to L middle trapezius x 2, R middle trapezius x 1 with 0.25 x 40 single needle placements with local twitch response (LTR). Pistoning technique utilized. Minimal post-needling soreness noted today.    Therapeutic Exercise - for improved soft tissue flexibility and extensibility as needed for ROM, postural re-edu and periscapular strengthening to improve tolerance of sustained upright activity and clinical work  Runner, broadcasting/film/video; 1x10, quadruped Prone T; 2x10, 3 sec hold  Column L (horizontal abduction with shoulders and elbows at 90 deg flexion); 2x10, 3 sec hold   PATIENT EDUCATION: Tactile and verbal cueing as well as demo for exercise technique.    *not today* Doorway pec stretch; initiated in 90/90 position; performed x 30 sec  -modified to 60 deg abduction in doorway to improve tolerance due to shoulder discomfort; 3x30sec Standing row with Black Tband; 2x10, 3 sec hold  Upper trapezius stretch, reviewed Levator scapulae stretch, reviewed      PATIENT EDUCATION:  Education details: Plan of care Person educated: Patient Education method: Explanation Education comprehension: verbalized understanding     HOME EXERCISE PROGRAM: Access Code: [email or web address]  URL: [email or web address] Date: 09/10/2022 Prepared by: Valentina Gu   Exercises - Seated Levator Scapulae Stretch  - 2 x daily - 7 x weekly - 3 sets - 30sec hold - Seated Scapular Retraction  - 2 x daily - 7 x weekly - 2 sets - 10 reps - 5sec hold - Seated Self Cervical Traction  - 2 x daily - 7 x weekly - 1-2 sets - 10 reps -  10sec hold     ASSESSMENT:   CLINICAL IMPRESSION: Patient arrives with excellent motivation to participate in physical therapy. She presents with good cervical spine AROM without notable pain this evening. She does have intermittent flare-ups of cervicothoracic pain with work and intermittent low back pain as well. We progressed with posural re-edu and periscapular strengthening today with pt tolerating these activities well. She does have discomfort with 90/90 position for pec stretching - we used modified version (60 deg abduction with doorway pec stretch) and this was better tolerated. Pt has mild sensitivity affecting R levator scapulae/rhomboid musculature and has good response with soft tissue mobilization/trigger point release today. Patient has remaining deficits in moderate postural changes, cervical spine AROM limitations, pain accessing end-range shoulder elevation, and pain with loading into shoulder flexion and abduction. Patient will benefit from continued skilled therapeutic intervention to address the above deficits as needed for improved function and QoL.     REHAB POTENTIAL: Good   CLINICAL DECISION MAKING: Evolving/moderate complexity   EVALUATION COMPLEXITY: Moderate     GOALS: Goals reviewed with patient? Yes   SHORT TERM GOALS: Target date: 10/01/2022   Pt will be independent with HEP to improve strength and decrease neck pain to improve pain-free function at home and work. Baseline: 09/10/22: Baseline HEP initiated  Goal status: INITIAL     LONG TERM GOALS: Target date: 10/22/2022   Pt will increase FOTO to at least 60 to demonstrate significant improvement in function at home and work related to neck pain  Baseline: 09/10/22: 50 Goal status: INITIAL   2.  Pt will decrease worst neck pain by at least 2 points on the NPRS in order to demonstrate clinically significant reduction in neck pain. Baseline: 09/10/22: 10/10 at worst  Goal status: INITIAL   3.  Pt will  demonstrate normal cervical spine AROM without reproduction of L-sided neck or periscapular pain as needed for scanning environment, overhead activity, and driving Baseline: [id number]: Moderate motion loss with extension and bilateral rotation; pain with flexion and L rotation.  Goal status: INITIAL   4.  Patient will sleep through the night without disturbance secondary to neck pain  Baseline: 09/10/22: Pt reports frequent disturbed sleep and difficulty getting to sleep  Goal status: INITIAL     PLAN: PT FREQUENCY: 2x/week   PT DURATION: 4-6 weeks   PLANNED INTERVENTIONS: Therapeutic exercises, Therapeutic activity, Neuromuscular re-education, Patient/Family education, Joint manipulation, Joint mobilization, Dry Needling, Electrical stimulation, Spinal manipulation, Spinal mobilization, Cryotherapy, Moist heat, Traction, and Manual therapy   PLAN FOR NEXT SESSION: Continue with use of traction and symptom modulation strategies, STM. Consider dry needling. Peri-scapular strengthening and postural re-edu for ADLs and work       Valentina Gu, PT, DPT [id number]  Eilleen Kempf, PT 10/07/2022, 3:17 PM

## 2022-10-07 NOTE — Telephone Encounter (Signed)
Pt called to report she had 3rd PT appt today. Her 4th is scheduled for this Thursday. When will surgery be scheduled? How many PT appointments does she need to have? Call pt with update [phone]

## 2022-10-08 NOTE — Telephone Encounter (Signed)
Victoria Elliott called patient and provided instructions to call our office once she completes the 5th PT (needs 6 total) to schedule appt with PA.

## 2022-10-09 ENCOUNTER — Encounter: Payer: Self-pay | Admitting: Physical Therapy

## 2022-10-09 ENCOUNTER — Ambulatory Visit: Payer: 59 | Admitting: Physical Therapy

## 2022-10-09 DIAGNOSIS — G8929 Other chronic pain: Secondary | ICD-10-CM

## 2022-10-09 DIAGNOSIS — M542 Cervicalgia: Secondary | ICD-10-CM | POA: Diagnosis not present

## 2022-10-09 DIAGNOSIS — M6281 Muscle weakness (generalized): Secondary | ICD-10-CM

## 2022-10-09 NOTE — Therapy (Addendum)
OUTPATIENT PHYSICAL THERAPY TREATMENT NOTE   Patient Name: Victoria Elliott MRN: [id number] DOB:21-Oct-1985, 37 y.o., female Today's Date: 10/09/2022  PCP: [facility], [place] REFERRING PROVIDER: Santiago Glad, MD   END OF SESSION:   PT End of Session - 10/09/22 1513     Visit Number 4    Number of Visits 13    Date for PT Re-Evaluation 10/22/22    Authorization Type Medicaid [employer]    Authorization Time Period Auth not req'd    Progress Note Due on Visit 10    PT Start Time 1515    PT Stop Time 1559    PT Time Calculation (min) 44 min    Activity Tolerance Patient tolerated treatment well    Behavior During Therapy [facility] for tasks assessed/performed               Past Medical History:  Diagnosis Date   Gall stones    Past Surgical History:  Procedure Laterality Date   CESAREAN SECTION  04/22/2013   CESAREAN SECTION  12/17/2020   Procedure: CESAREAN SECTION;  Surgeon: Schermerhorn, Ihor Austin, MD;  Location: [facility] ORS;  Service: Obstetrics;;   DILATION AND CURETTAGE OF UTERUS     DILATION AND EVACUATION N/A 09/25/2017   Procedure: DILATATION AND EVACUATION;  Surgeon: Christeen Douglas, MD;  Location: [facility] OR[facility];  Service: Gynecology;  Laterality: N/A;   ESOPHAGOGASTRODUODENOSCOPY (EGD) WITH PROPOFOL N/A 07/31/2021   Procedure: ESOPHAGOGASTRODUODENOSCOPY (EGD) WITH PROPOFOL;  Surgeon: Toney Reil, MD;  Location: [facility];  Service: Gastroenterology;  Laterality: N/A;   Patient Active Problem List   Diagnosis Date Noted   Abdominal pain, chronic, epigastric    Antral erythema    Vaginal bleeding 04/22/2021   Gestational hypertension 12/18/2020   Prediabetes 12/18/2020   Uterine size date discrepancy, third trimester 12/18/2020   Previous cesarean delivery affecting pregnancy 12/17/2020   Postoperative state 12/17/2020   Lower back pain 12/05/2020   Gastritis 08/29/2020   Esophageal reflux 03/29/2014    REFERRING DIAG:  N62 (ICD-10-CM)  - Breast hypertrophy  M54.2,M25.519 (ICD-10-CM) - Neck and shoulder pain    THERAPY DIAG:  Cervicalgia  Chronic left shoulder pain  Muscle weakness (generalized)  Rationale for Evaluation and Treatment Rehabilitation  PERTINENT HISTORY: Pt is a 37 year old female considering breast reduction surgery with primary complaint of L>R periscapular/paracervical pain. Pt reports worsening pain in shoulders/traps and into paracervical region following having her second child. She reports losing circulation into her hands. Patient reports pain into shoulders and back. She reports OTC medications do not help. Patient reports some tingling into her hands intermittently. She reports difficulty with moving her shoulder at times. Pt reports pain along L periscapular region mainly recently, though she has experienced pain on R and L. Patient reports disturbed sleep and waking up in middle of night. She states that position change can help sometimes. Pt denies bowel/bladder changes. No sudden weight change. Pt manages depression and anxiety with medications.    Pain:  Pain Intensity: Present: 8-9/10, Best: 1/10, Worst: 10/10 Pain location: L>R periscapular region, lower cervical Pain Quality: aching  Radiating: No  Numbness/Tingling: Yes Focal Weakness: No Aggravating factors: sometimes pain without specific trigger, leaning onto L arm Relieving factors: essential oil topical treatment 24-hour pain behavior: None History of prior neck injury, pain, surgery, or therapy: No Falls: Has patient fallen in last 6 months? 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  Hobbies: travel, outdoor activities  Red flags (personal history of cancer, h/o spinal tumors, history of compression fracture, chills/fever, night sweats, nausea, vomiting, unrelenting pain): Negative   Weight Bearing  Restrictions: No   Living Environment Lives with: lives with their son and lives with their daughter Lives in: House/apartment     Patient Goals: Pt is coming here prior to considering breast reduction surgery     PRECAUTIONS: None    SUBJECTIVE:                                                                                                                                                                                      SUBJECTIVE STATEMENT:  Patient reports no notable pain in periscapular region recently. She reports some pain with reaching L shoulder into hyper-horizontal abduction or hyperextension. Pt reports having hypermobility in shoulders for long time. Patient reports compliance with home exercise program.    PAIN:  Are you having pain? Minor pain along L>R periscapular region Pain location: L UT   OBJECTIVE: (objective measures completed at initial evaluation unless otherwise dated)   Patient Surveys  FOTO 50, predicted outcome score of 60   Cognition Patient is oriented to person, place, and time.  Recent memory is intact.  Remote memory is intact.  Attention span and concentration are intact.  Expressive speech is intact.  Patient's fund of knowledge is within normal limits for educational level.                          Gross Musculoskeletal Assessment Tremor: None Bulk: Normal Tone: Normal     Posture Rounded shoulders posture at rest, anteriorly tilted scapulae     AROM   Shoulder AROM Flexion WNL, pain at end-range Abduction WNL, pain in mid-range   AROM (Normal range in degrees) AROM 09/10/2022  Cervical  Flexion (50) 44*  Extension (80) 52* at end-range  Right lateral flexion (45) 42  Left lateral flexion (45) 45  Right rotation (85) 72  Left rotation (85) 71*  (* = pain; Blank rows = not tested)     MMT MMT (out of 5) Right 09/10/2022 Left 09/10/2022         Shoulder   Flexion      Extension 5 4  Abduction 5 4*  Internal  rotation      Horizontal abduction      Horizontal adduction      Lower Trapezius      Rhomboids             Elbow  Flexion 5 5  Extension 5 5  Pronation      Supination             Wrist  Flexion 5 5  Extension 5 5  Radial deviation      Ulnar deviation      Finger ABD          WNL          WNL  (* = pain; Blank rows = not tested)   Sensation Grossly intact to light touch bilateral UE as determined by testing dermatomes C2-T2. Proprioception and hot/cold testing deferred on this date.   Reflexes R/L Elbow: 2+/2+  Brachioradialis: 2+/2+  Tricep: 2+/2+   Palpation Location LEFT  RIGHT           Suboccipitals      Cervical paraspinals 0 0  Upper Trapezius 1 0  Levator Scapulae 2 0  Rhomboid Major/Minor 2 0  (Blank rows = not tested) Graded on 0-4 scale (0 = no pain, 1 = pain, 2 = pain with wincing/grimacing/flinching, 3 = pain with withdrawal, 4 = unwilling to allow palpation), (Blank rows = not tested)     Repeated Movements Deferred today    Passive Accessory Intervertebral Motion Deferred     SPECIAL TESTS Spurlings A (ipsilateral lateral flexion/axial compression): R: Negative L: Negative Distraction Test: Positive  Hoffman Sign (cervical cord compression): R: Negative L: Negative ULTT Median: R: Not done L: Negative  ULTT Ulnar: R: Not done L: Negative  ULTT Radial: R: Not done L: Negative    TOS Adon Test: Negative  Roo's Test: Negative  Wright Test: Negative      TODAY'S TREATMENT     Manual Therapy - for symptom modulation, soft tissue sensitivity and mobility, joint mobility, ROM   *not today* Manual cervical traction; 10 sec on, 10 sec off; x 8 minutes  -minimal paracervical/periscapular pain during use of traction; we reviewed use of manual self-traction during this intervention STM/DTM and IASTM with Hypervolt L>R middle trapezius/rhomboid mm, L>R longissimus thoracis at T3-T7 levels; x 15 minutes Trigger Point Dry Needling (TDN),  unbilled Education performed with patient regarding potential benefit of TDN. Reviewed precautions and risks with patient. Reviewed special precautions/risks over lung fields which include pneumothorax. Reviewed signs and symptoms of pneumothorax and advised pt to go to ER immediately if these symptoms develop advise them of dry needling treatment. Extensive time spent with pt to ensure full understanding of TDN risks. Pt provided verbal consent to treatment. TDN performed to L middle trapezius x 2, R middle trapezius x 1 with 0.25 x 40 single needle placements with local twitch response (LTR). Pistoning technique utilized. Minimal post-needling soreness noted today.      Therapeutic Exercise - for improved soft tissue flexibility and extensibility as needed for ROM, postural re-edu and periscapular strengthening to improve tolerance of sustained upright activity and clinical work  Upper body ergometer, 2 minutes forward, 2 minutes backward - for tissue warm-up to improve muscle performance, improved soft tissue mobility/extensibility - 2 min unbilled   Cat Camel; 1x10, quadruped Prone T; 2x10, 3 sec hold  Prone W; 1x10, 3 sec hold  Bird dog; 2x10 alternating R/L  Wall angel; 2x10  Nautilus; standing row; 30 lbs with bilat UE (2 single handles)    PATIENT EDUCATION: Tactile and verbal cueing as well as demo for exercise technique.    *not today* Column L (horizontal abduction with shoulders and elbows at 90 deg flexion); 2x10, 3 sec hold Doorway pec stretch; initiated in 90/90 position;  performed x 30 sec  -modified to 60 deg abduction in doorway to improve tolerance due to shoulder discomfort; 3x30sec Standing row with Black Tband; 2x10, 3 sec hold  Upper trapezius stretch, reviewed Levator scapulae stretch, reviewed      PATIENT EDUCATION:  Education details: Plan of care Person educated: Patient Education method: Explanation Education comprehension: verbalized understanding      HOME EXERCISE PROGRAM: Access Code: [email or web address] URL: [email or web address] Date: 10/13/2022 Prepared by: Consuela Mimes  Exercises - Seated Self Cervical Traction  - 2 x daily - 7 x weekly - 1-2 sets - 10 reps - 10sec hold - Seated Levator Scapulae Stretch  - 2 x daily - 7 x weekly - 3 sets - 30sec hold - Corner Pec Minor Stretch  - 2 x daily - 7 x weekly - 3 sets - 30sec hold - Prone Scapular Retraction Arms at Side  - 1 x daily - 7 x weekly - 2 sets - 10 reps - 3sec hold - Standing Shoulder Row with Anchored Resistance  - 1 x daily - 7 x weekly - 2 sets - 10 reps - 3sec hold     ASSESSMENT:   CLINICAL IMPRESSION: Patient is very pleasant and participates well with physical therapy.  We moved away from focus on dry needling and manual therapy today due to pt having no significant symptoms at baseline. We focused on periscapular strengthening and postural re-edu today including mobility work to improve pectoralis flexibility and improve rounded/anteriorly tilted position of scapulae. Pt tolerates progression in exercise intensity well. Patient has remaining deficits in moderate postural changes, cervical spine AROM limitations, pain accessing end-range shoulder elevation, and pain with loading into shoulder flexion and abduction. Patient will benefit from continued skilled therapeutic intervention to address the above deficits as needed for improved function and QoL.     REHAB POTENTIAL: Good   CLINICAL DECISION MAKING: Evolving/moderate complexity   EVALUATION COMPLEXITY: Moderate     GOALS: Goals reviewed with patient? Yes   SHORT TERM GOALS: Target date: 10/01/2022   Pt will be independent with HEP to improve strength and decrease neck pain to improve pain-free function at home and work. Baseline: 09/10/22: Baseline HEP initiated  Goal status: INITIAL     LONG TERM GOALS: Target date: 10/22/2022   Pt will increase FOTO to at least 60 to demonstrate significant  improvement in function at home and work related to neck pain  Baseline: 09/10/22: 50 Goal status: INITIAL   2.  Pt will decrease worst neck pain by at least 2 points on the NPRS in order to demonstrate clinically significant reduction in neck pain. Baseline: 09/10/22: 10/10 at worst  Goal status: INITIAL   3.  Pt will demonstrate normal cervical spine AROM without reproduction of L-sided neck or periscapular pain as needed for scanning environment, overhead activity, and driving Baseline: 01/10/51: Moderate motion loss with extension and bilateral rotation; pain with flexion and L rotation.  Goal status: INITIAL   4.  Patient will sleep through the night without disturbance secondary to neck pain  Baseline: 09/10/22: Pt reports frequent disturbed sleep and difficulty getting to sleep  Goal status: INITIAL     PLAN: PT FREQUENCY: 2x/week   PT DURATION: 4-6 weeks   PLANNED INTERVENTIONS: Therapeutic exercises, Therapeutic activity, Neuromuscular re-education, Patient/Family education, Joint manipulation, Joint mobilization, Dry Needling, Electrical stimulation, Spinal manipulation, Spinal mobilization, Cryotherapy, Moist heat, Traction, and Manual therapy   PLAN FOR NEXT SESSION: Continue with use of traction and symptom modulation  strategies as needed, STM. DN at future session as needed. Peri-scapular strengthening/scap stability and postural re-edu for ADLs and work.       Consuela Mimes, PT, DPT #[id number]  Gertie Exon, PT 10/09/2022, 3:14 PM

## 2022-10-13 ENCOUNTER — Encounter: Payer: Medicaid Other | Admitting: Physical Therapy

## 2022-10-14 ENCOUNTER — Ambulatory Visit: Payer: Medicaid Other | Admitting: Physical Therapy

## 2022-10-15 ENCOUNTER — Encounter: Payer: Medicaid Other | Admitting: Physical Therapy

## 2022-10-16 ENCOUNTER — Encounter: Payer: Medicaid Other | Admitting: Physical Therapy

## 2022-10-21 ENCOUNTER — Ambulatory Visit: Payer: Medicaid Other | Admitting: Physical Therapy

## 2022-10-21 NOTE — Therapy (Deleted)
OUTPATIENT PHYSICAL THERAPY TREATMENT NOTE   Patient Name: Candence Sease MRN: [id number] DOB:Jun 29, 1986, 37 y.o., female Today's Date: 10/21/2022  PCP: [facility], [place] REFERRING PROVIDER: Santiago Glad, MD   END OF SESSION:       Past Medical History:  Diagnosis Date   Gall stones    Past Surgical History:  Procedure Laterality Date   CESAREAN SECTION  04/22/2013   CESAREAN SECTION  12/17/2020   Procedure: CESAREAN SECTION;  Surgeon: Schermerhorn, Ihor Austin, MD;  Location: [facility] ORS;  Service: Obstetrics;;   DILATION AND CURETTAGE OF UTERUS     DILATION AND EVACUATION N/A 09/25/2017   Procedure: DILATATION AND EVACUATION;  Surgeon: Christeen Douglas, MD;  Location: [facility] ORS;  Service: Gynecology;  Laterality: N/A;   ESOPHAGOGASTRODUODENOSCOPY (EGD) WITH PROPOFOL N/A 07/31/2021   Procedure: ESOPHAGOGASTRODUODENOSCOPY (EGD) WITH PROPOFOL;  Surgeon: Toney Reil, MD;  Location: [facility] ENDOSCOPY;  Service: Gastroenterology;  Laterality: N/A;   Patient Active Problem List   Diagnosis Date Noted   Abdominal pain, chronic, epigastric    Antral erythema    Vaginal bleeding 04/22/2021   Gestational hypertension 12/18/2020   Prediabetes 12/18/2020   Uterine size date discrepancy, third trimester 12/18/2020   Previous cesarean delivery affecting pregnancy 12/17/2020   Postoperative state 12/17/2020   Lower back pain 12/05/2020   Gastritis 08/29/2020   Esophageal reflux 03/29/2014    REFERRING DIAG:  N62 (ICD-10-CM) - Breast hypertrophy  M54.2,M25.519 (ICD-10-CM) - Neck and shoulder pain    THERAPY DIAG:  Cervicalgia  Chronic left shoulder pain  Muscle weakness (generalized)  Rationale for Evaluation and Treatment Rehabilitation  PERTINENT HISTORY: Pt is a 37 year old female considering breast reduction surgery with primary complaint of L>R periscapular/paracervical pain. Pt reports worsening pain in shoulders/traps and into paracervical region  following having her second child. She reports losing circulation into her hands. Patient reports pain into shoulders and back. She reports OTC medications do not help. Patient reports some tingling into her hands intermittently. She reports difficulty with moving her shoulder at times. Pt reports pain along L periscapular region mainly recently, though she has experienced pain on R and L. Patient reports disturbed sleep and waking up in middle of night. She states that position change can help sometimes. Pt denies bowel/bladder changes. No sudden weight change. Pt manages depression and anxiety with medications.    Pain:  Pain Intensity: Present: 8-9/10, Best: 1/10, Worst: 10/10 Pain location: L>R periscapular region, lower cervical Pain Quality: aching  Radiating: No  Numbness/Tingling: Yes Focal Weakness: No Aggravating factors: sometimes pain without specific trigger, leaning onto L arm Relieving factors: essential oil topical treatment 24-hour pain behavior: None History of prior neck injury, pain, surgery, or therapy: No Falls: Has patient fallen in last 6 months? 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 She reports some pain with reaching L shoulder into hyper-horizontal abduction or hyperextension. Pt reports having hypermobility in shoulders for long time. Patient reports compliance with home exercise program.    PAIN:  Are you having pain? Minor pain along L>R periscapular region Pain location: L UT   OBJECTIVE: (objective measures completed at initial evaluation unless otherwise dated)   Patient Surveys  FOTO 50, predicted outcome score of 60   Cognition Patient is oriented to person, place, and time.  Recent memory is intact.  Remote memory is intact.  Attention span and concentration are intact.  Expressive speech is intact.  Patient's fund of knowledge is within normal limits for educational level.                          Gross Musculoskeletal Assessment Tremor: None Bulk: Normal Tone: Normal     Posture Rounded shoulders posture at rest, anteriorly tilted scapulae     AROM   Shoulder AROM Flexion WNL, pain at end-range Abduction WNL, pain in mid-range   AROM (Normal range in degrees) AROM 09/10/2022  Cervical  Flexion (50) 44*  Extension (80) 52* at end-range  Right lateral flexion (45) 42  Left lateral flexion (45) 45  Right rotation (85) 72  Left rotation (85) 71*  (* = pain; Blank rows = not tested)     MMT MMT (out of 5) Right 09/10/2022 Left 09/10/2022         Shoulder   Flexion      Extension 5 4  Abduction 5 4*  Internal rotation      Horizontal abduction      Horizontal adduction      Lower Trapezius      Rhomboids             Elbow  Flexion 5 5  Extension 5 5  Pronation      Supination             Wrist  Flexion 5 5  Extension 5 5  Radial deviation      Ulnar deviation      Finger ABD          WNL          WNL  (* = pain; Blank rows = not tested)   Sensation Grossly intact to light touch bilateral UE as determined by testing  dermatomes C2-T2. Proprioception and hot/cold testing deferred on this date.   Reflexes R/L Elbow: 2+/2+  Brachioradialis: 2+/2+  Tricep: 2+/2+   Palpation Location LEFT  RIGHT           Suboccipitals      Cervical paraspinals 0 0  Upper Trapezius 1 0  Levator Scapulae 2 0  Rhomboid Major/Minor 2 0  (Blank rows = not tested) Graded on 0-4 scale (0 = no pain, 1 = pain, 2 = pain with wincing/grimacing/flinching, 3 = pain with withdrawal, 4 = unwilling to allow palpation), (Blank rows = not tested)     Repeated Movements Deferred today    Passive Accessory Intervertebral Motion Deferred     SPECIAL TESTS Spurlings A (ipsilateral lateral flexion/axial compression): R: Negative L: Negative Distraction Test: Positive  Hoffman Sign (cervical cord compression): R: Negative L: Negative ULTT Median: R: Not done L: Negative  ULTT Ulnar: R: Not done L: Negative  ULTT Radial: R: Not done L: Negative    TOS Adon  Test: Negative  Roo's Test: Negative  Wright Test: Negative      TODAY'S TREATMENT     Manual Therapy - for symptom modulation, soft tissue sensitivity and mobility, joint mobility, ROM   *not today* Manual cervical traction; 10 sec on, 10 sec off; x 8 minutes  -minimal paracervical/periscapular pain during use of traction; we reviewed use of manual self-traction during this intervention STM/DTM and IASTM with Hypervolt L>R middle trapezius/rhomboid mm, L>R longissimus thoracis at T3-T7 levels; x 15 minutes Trigger Point Dry Needling (TDN), unbilled Education performed with patient regarding potential benefit of TDN. Reviewed precautions and risks with patient. Reviewed special precautions/risks over lung fields which include pneumothorax. Reviewed signs and symptoms of pneumothorax and advised pt to go to ER immediately if these symptoms develop advise them of dry needling treatment. Extensive time spent with pt to ensure full understanding of TDN risks. Pt provided  verbal consent to treatment. TDN performed to L middle trapezius x 2, R middle trapezius x 1 with 0.25 x 40 single needle placements with local twitch response (LTR). Pistoning technique utilized. Minimal post-needling soreness noted today.      Therapeutic Exercise - for improved soft tissue flexibility and extensibility as needed for ROM, postural re-edu and periscapular strengthening to improve tolerance of sustained upright activity and clinical work  Upper body ergometer, 2 minutes forward, 2 minutes backward - for tissue warm-up to improve muscle performance, improved soft tissue mobility/extensibility - 2 min unbilled   Cat Camel; 1x10, quadruped Prone T; 2x10, 3 sec hold  Prone W; 1x10, 3 sec hold  Bird dog; 2x10 alternating R/L  Wall angel; 2x10  Nautilus; standing row; 30 lbs with bilat UE (2 single handles)    PATIENT EDUCATION: Tactile and verbal cueing as well as demo for exercise technique.    *not today* Column L (horizontal abduction with shoulders and elbows at 90 deg flexion); 2x10, 3 sec hold Doorway pec stretch; initiated in 90/90 position; performed x 30 sec  -modified to 60 deg abduction in doorway to improve tolerance due to shoulder discomfort; 3x30sec Standing row with Black Tband; 2x10, 3 sec hold  Upper trapezius stretch, reviewed Levator scapulae stretch, reviewed      PATIENT EDUCATION:  Education details: Plan of care Person educated: Patient Education method: Explanation Education comprehension: verbalized understanding     HOME EXERCISE PROGRAM: Access Code: [email or web address] URL: [email or web address] Date: 10/13/2022 Prepared by: Consuela Mimes  Exercises - Seated Self Cervical Traction  - 2 x daily - 7 x weekly - 1-2 sets - 10 reps - 10sec hold - Seated Levator Scapulae Stretch  - 2 x daily - 7 x weekly - 3 sets - 30sec hold - Corner Pec Minor Stretch  - 2 x daily - 7 x weekly - 3 sets - 30sec hold - Prone Scapular Retraction  Arms at Side  - 1 x daily - 7 x weekly - 2 sets - 10 reps - 3sec hold - Standing Shoulder Row with Anchored Resistance  - 1 x daily - 7 x weekly - 2 sets - 10 reps - 3sec hold     ASSESSMENT:   CLINICAL IMPRESSION: Patient is very pleasant and participates well with physical therapy.  We moved away from focus on dry needling and manual therapy today due to pt having no significant symptoms at baseline. We focused on periscapular strengthening and postural re-edu today including mobility work to improve pectoralis flexibility and improve rounded/anteriorly tilted position of scapulae. Pt tolerates progression in  exercise intensity well. Patient has remaining deficits in moderate postural changes, cervical spine AROM limitations, pain accessing end-range shoulder elevation, and pain with loading into shoulder flexion and abduction. Patient will benefit from continued skilled therapeutic intervention to address the above deficits as needed for improved function and QoL.     REHAB POTENTIAL: Good   CLINICAL DECISION MAKING: Evolving/moderate complexity   EVALUATION COMPLEXITY: Moderate     GOALS: Goals reviewed with patient? Yes   SHORT TERM GOALS: Target date: 10/01/2022   Pt will be independent with HEP to improve strength and decrease neck pain to improve pain-free function at home and work. Baseline: 09/10/22: Baseline HEP initiated  Goal status: INITIAL     LONG TERM GOALS: Target date: 10/22/2022   Pt will increase FOTO to at least 60 to demonstrate significant improvement in function at home and work related to neck pain  Baseline: 09/10/22: 50 Goal status: INITIAL   2.  Pt will decrease worst neck pain by at least 2 points on the NPRS in order to demonstrate clinically significant reduction in neck pain. Baseline: 09/10/22: 10/10 at worst  Goal status: INITIAL   3.  Pt will demonstrate normal cervical spine AROM without reproduction of L-sided neck or periscapular pain as needed  for scanning environment, overhead activity, and driving Baseline: 9/47/65: Moderate motion loss with extension and bilateral rotation; pain with flexion and L rotation.  Goal status: INITIAL   4.  Patient will sleep through the night without disturbance secondary to neck pain  Baseline: 09/10/22: Pt reports frequent disturbed sleep and difficulty getting to sleep  Goal status: INITIAL     PLAN: PT FREQUENCY: 2x/week   PT DURATION: 4-6 weeks   PLANNED INTERVENTIONS: Therapeutic exercises, Therapeutic activity, Neuromuscular re-education, Patient/Family education, Joint manipulation, Joint mobilization, Dry Needling, Electrical stimulation, Spinal manipulation, Spinal mobilization, Cryotherapy, Moist heat, Traction, and Manual therapy   PLAN FOR NEXT SESSION: Continue with use of traction and symptom modulation strategies as needed, STM. DN at future session as needed. Peri-scapular strengthening/scap stability and postural re-edu for ADLs and work.       Consuela Mimes, PT, DPT #[id number]  Gertie Exon, PT 10/21/2022, 7:33 AM

## 2022-10-23 ENCOUNTER — Ambulatory Visit: Payer: 59 | Attending: Plastic Surgery | Admitting: Physical Therapy

## 2022-10-23 DIAGNOSIS — G8929 Other chronic pain: Secondary | ICD-10-CM | POA: Diagnosis present

## 2022-10-23 DIAGNOSIS — M542 Cervicalgia: Secondary | ICD-10-CM | POA: Insufficient documentation

## 2022-10-23 DIAGNOSIS — M6281 Muscle weakness (generalized): Secondary | ICD-10-CM | POA: Insufficient documentation

## 2022-10-23 DIAGNOSIS — M25512 Pain in left shoulder: Secondary | ICD-10-CM | POA: Diagnosis present

## 2022-10-23 NOTE — Therapy (Signed)
OUTPATIENT PHYSICAL THERAPY TREATMENT NOTE   Patient Name: Victoria Elliott MRN: [id number] DOB:1986-02-27, 37 y.o., female Today's Date: 10/23/2022  PCP: [facility], [place] REFERRING PROVIDER: Santiago Gladaylor, Jackson B, MD   END OF SESSION:   PT End of Session - 10/23/22 1602     Visit Number 5    Number of Visits 13    Date for PT Re-Evaluation 10/22/22    Authorization Type Medicaid [employer]    Authorization Time Period Auth not req'd    Progress Note Due on Visit 10    PT Start Time 1603    PT Stop Time 1645    PT Time Calculation (min) 42 min    Activity Tolerance Patient tolerated treatment well    [facility] tasks assessed/performed                Past Medical History:  Diagnosis Date   Gall stones    Past Surgical History:  Procedure Laterality Date   CESAREAN SECTION  04/22/2013   CESAREAN SECTION  12/17/2020   Procedure: CESAREAN SECTION;  Surgeon: Schermerhorn, Ihor Austinhomas J, MD;  Location: [facility] ORS;  Service: Obstetrics;;   DILATION AND CURETTAGE OF UTERUS     DILATION AND EVACUATION N/A 09/25/2017   Procedure: DILATATION AND EVACUATION;  Surgeon: Christeen DouglasBeasley, Bethany, MD;  Location: [facility] OR[facility];  Service: Gynecology;  Laterality: N/A;   ESOPHAGOGASTRODUODENOSCOPY (EGD) WITH PROPOFOL N/A 07/31/2021   Procedure: ESOPHAGOGASTRODUODENOSCOPY (EGD) WITH PROPOFOL;  Surgeon: Toney ReilVanga, Rohini Reddy, MD;  [facility];  Service: Gastroenterology;  Laterality: N/A;   Patient Active Problem List   Diagnosis Date Noted   Abdominal pain, chronic, epigastric    Antral erythema    Vaginal bleeding 04/22/2021   Gestational hypertension 12/18/2020   Prediabetes 12/18/2020   Uterine size date discrepancy, third trimester 12/18/2020   Previous cesarean delivery affecting pregnancy 12/17/2020   Postoperative state 12/17/2020   Lower back pain 12/05/2020   Gastritis 08/29/2020   Esophageal reflux 03/29/2014    REFERRING DIAG:  N62  (ICD-10-CM) - Breast hypertrophy  M54.2,M25.519 (ICD-10-CM) - Neck and shoulder pain    THERAPY DIAG:  Cervicalgia  Chronic left shoulder pain  Muscle weakness (generalized)  Rationale for Evaluation and Treatment Rehabilitation  PERTINENT HISTORY: Pt is a 37 year old female considering breast reduction surgery with primary complaint of L>R periscapular/paracervical pain. Pt reports worsening pain in shoulders/traps and into paracervical region following having her second child. She reports losing circulation into her hands. Patient reports pain into shoulders and back. She reports OTC medications do not help. Patient reports some tingling into her hands intermittently. She reports difficulty with moving her shoulder at times. Pt reports pain along L periscapular region mainly recently, though she has experienced pain on R and L. Patient reports disturbed sleep and waking up in middle of night. She states that position change can help sometimes. Pt denies bowel/bladder changes. No sudden weight change. Pt manages depression and anxiety with medications.    Pain:  Pain Intensity: Present: 8-9/10, Best: 1/10, Worst: 10/10 Pain location: L>R periscapular region, lower cervical Pain Quality: aching  Radiating: No  Numbness/Tingling: Yes Focal Weakness: No Aggravating factors: sometimes pain without specific trigger, leaning onto L arm Relieving factors: essential oil topical treatment 24-hour pain behavior: None History of prior neck injury, pain, surgery, or therapy: No Falls: Has patient fallen in last 6 months? 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  Hobbies: travel, outdoor activities  Red flags (personal history of cancer, h/o spinal tumors, history of compression fracture, chills/fever, night sweats, nausea, vomiting, unrelenting pain): Negative    Weight Bearing Restrictions: No   Living Environment Lives with: lives with their son and lives with their daughter Lives in: House/apartment     Patient Goals: Pt is coming here prior to considering breast reduction surgery     PRECAUTIONS: None    SUBJECTIVE:                                                                                                                                                                                      SUBJECTIVE STATEMENT:  Patient reports having increase in shoulder/neck pain following busy weekend and being active with her children. She reports pain affecting posterior cervical paraspinal region and L upper trapezius. She feels she did okay with last PT visit, but she did have aforementioned issues toward end of that week.    PAIN:  Are you having pain? 5/10 pain affecting L upper trapezius, posterior cervical paraspinal region  Pain location: L UT   OBJECTIVE: (objective measures completed at initial evaluation unless otherwise dated)   Patient Surveys  FOTO 50, predicted outcome score of 60   Cognition Patient is oriented to person, place, and time.  Recent memory is intact.  Remote memory is intact.  Attention span and concentration are intact.  Expressive speech is intact.  Patient's fund of knowledge is within normal limits for educational level.                          Gross Musculoskeletal Assessment Tremor: None Bulk: Normal Tone: Normal     Posture Rounded shoulders posture at rest, anteriorly tilted scapulae     AROM   Shoulder AROM Flexion WNL, pain at end-range Abduction WNL, pain in mid-range   AROM (Normal range in degrees) AROM 09/10/2022  Cervical  Flexion (50) 44*  Extension (80) 52* at end-range  Right lateral flexion (45) 42  Left lateral flexion (45) 45  Right rotation (85) 72  Left rotation (85) 71*  (* = pain; Blank rows = not tested)     MMT MMT (out of 5) Right 09/10/2022  Left 09/10/2022         Shoulder   Flexion      Extension 5 4  Abduction 5 4*  Internal rotation      Horizontal abduction      Horizontal adduction      Lower Trapezius      Rhomboids  Elbow  Flexion 5 5  Extension 5 5  Pronation      Supination             Wrist  Flexion 5 5  Extension 5 5  Radial deviation      Ulnar deviation      Finger ABD          WNL          WNL  (* = pain; Blank rows = not tested)   Sensation Grossly intact to light touch bilateral UE as determined by testing dermatomes C2-T2. Proprioception and hot/cold testing deferred on this date.   Reflexes R/L Elbow: 2+/2+  Brachioradialis: 2+/2+  Tricep: 2+/2+   Palpation Location LEFT  RIGHT           Suboccipitals      Cervical paraspinals 0 0  Upper Trapezius 1 0  Levator Scapulae 2 0  Rhomboid Major/Minor 2 0  (Blank rows = not tested) Graded on 0-4 scale (0 = no pain, 1 = pain, 2 = pain with wincing/grimacing/flinching, 3 = pain with withdrawal, 4 = unwilling to allow palpation), (Blank rows = not tested)     Repeated Movements Deferred today    Passive Accessory Intervertebral Motion Deferred     SPECIAL TESTS Spurlings A (ipsilateral lateral flexion/axial compression): R: Negative L: Negative Distraction Test: Positive  Hoffman Sign (cervical cord compression): R: Negative L: Negative ULTT Median: R: Not done L: Negative  ULTT Ulnar: R: Not done L: Negative  ULTT Radial: R: Not done L: Negative    TOS Adon Test: Negative  Roo's Test: Negative  Wright Test: Negative      TODAY'S TREATMENT     Manual Therapy - for symptom modulation, soft tissue sensitivity and mobility, joint mobility, ROM    Manual cervical traction; 10 sec on, 10 sec off; x 8 minutes  -minimal paracervical/periscapular pain during use of traction; we reviewed use of manual self-traction during this intervention  STM L>R upper trapezius, C3-C6 splenius cervicis/capitis; x 15  minutes    *not today* DTM and IASTM with Hypervolt L>R middle trapezius/rhomboid mm, L>R longissimus thoracis at T3-T7 levels    Trigger Point Dry Needling (TDN), unbilled Education performed with patient regarding potential benefit of TDN. Reviewed precautions and risks with patient. Reviewed special precautions/risks over lung fields which include pneumothorax. Reviewed signs and symptoms of pneumothorax and advised pt to go to ER immediately if these symptoms develop advise them of dry needling treatment. Extensive time spent with pt to ensure full understanding of TDN risks. Pt provided verbal consent to treatment. TDN performed to R and L splenius cervicis/capitis at C4-5 levels and C6-7 levels, bilateral upper trapezius with 0.25 x 40 single needle placements with local twitch response (LTR). Pistoning technique utilized. Moderate post-needling soreness noted today.      Therapeutic Exercise - for improved soft tissue flexibility and extensibility as needed for ROM, postural re-edu and periscapular strengthening to improve tolerance of sustained upright activity and clinical work  Upper body ergometer, 2 minutes forward, 2 minutes backward - for tissue warm-up to improve muscle performance, improved soft tissue mobility/extensibility - 2 min unbilled   Cat Camel; 1x10, quadruped Prone W; 2x10, 3 sec hold   -tactile cueing to avoid scapular elevaiton Wall angel; 2x10, with maintenance of cervical retraction  Nautilus; standing row; 40 lbs with bilat UE (2 single handles); 2x10   PATIENT EDUCATION: Tactile and verbal cueing as well as demo for exercise technique.    *  not today* Bird dog; 2x10 alternating R/L  Prone T; 2x10, 3 sec hold  Column L (horizontal abduction with shoulders and elbows at 90 deg flexion); 2x10, 3 sec hold Doorway pec stretch; initiated in 90/90 position; performed x 30 sec  -modified to 60 deg abduction in doorway to improve tolerance due to shoulder  discomfort; 3x30sec Standing row with Black Tband; 2x10, 3 sec hold  Upper trapezius stretch, reviewed Levator scapulae stretch, reviewed      PATIENT EDUCATION:  Education details: Plan of care Person educated: Patient Education method: Explanation Education comprehension: verbalized understanding     HOME EXERCISE PROGRAM: Access Code: [email or web address] URL: [email or web address] Date: 10/13/2022 Prepared by: Consuela Mimes  Exercises - Seated Self Cervical Traction  - 2 x daily - 7 x weekly - 1-2 sets - 10 reps - 10sec hold - Seated Levator Scapulae Stretch  - 2 x daily - 7 x weekly - 3 sets - 30sec hold - Corner Pec Minor Stretch  - 2 x daily - 7 x weekly - 3 sets - 30sec hold - Prone Scapular Retraction Arms at Side  - 1 x daily - 7 x weekly - 2 sets - 10 reps - 3sec hold - Standing Shoulder Row with Anchored Resistance  - 1 x daily - 7 x weekly - 2 sets - 10 reps - 3sec hold     ASSESSMENT:   CLINICAL IMPRESSION: Patient is very pleasant and participates well with physical therapy. Patient has been able to notably progress with active PT intervention given minimal symptoms and no notable movement deficits recently; however, pt reports notable left upper quarter pain after active weekend with her children and 5/10 pain affecting posterior C-spine paraspinals and L>R upper trapezius. We utilized manual traction and dry needling today for symptom modulation with moderate post-treatment soreness reported. Less volume of exercise today, though we continued focus on periscapular isotonics and postural re-edu.  Patient has remaining deficits in moderate postural changes, cervical spine AROM limitations, pain accessing end-range shoulder elevation, and pain with loading into shoulder flexion and abduction. Patient will benefit from continued skilled therapeutic intervention to address the above deficits as needed for improved function and QoL.     REHAB POTENTIAL: Good    CLINICAL DECISION MAKING: Evolving/moderate complexity   EVALUATION COMPLEXITY: Moderate     GOALS: Goals reviewed with patient? Yes   SHORT TERM GOALS: Target date: 10/01/2022   Pt will be independent with HEP to improve strength and decrease neck pain to improve pain-free function at home and work. Baseline: 09/10/22: Baseline HEP initiated  Goal status: INITIAL     LONG TERM GOALS: Target date: 10/22/2022   Pt will increase FOTO to at least 60 to demonstrate significant improvement in function at home and work related to neck pain  Baseline: 09/10/22: 50 Goal status: INITIAL   2.  Pt will decrease worst neck pain by at least 2 points on the NPRS in order to demonstrate clinically significant reduction in neck pain. Baseline: 09/10/22: 10/10 at worst  Goal status: INITIAL   3.  Pt will demonstrate normal cervical spine AROM without reproduction of L-sided neck or periscapular pain as needed for scanning environment, overhead activity, and driving Baseline: 5/40/98: Moderate motion loss with extension and bilateral rotation; pain with flexion and L rotation.  Goal status: INITIAL   4.  Patient will sleep through the night without disturbance secondary to neck pain  Baseline: 09/10/22: Pt reports frequent disturbed sleep and difficulty getting to sleep  Goal status: INITIAL     PLAN: PT FREQUENCY: 2x/week   PT DURATION: 4-6 weeks   PLANNED INTERVENTIONS: Therapeutic exercises, Therapeutic activity, Neuromuscular re-education, Patient/Family education, Joint manipulation, Joint mobilization, Dry Needling, Electrical stimulation, Spinal manipulation, Spinal mobilization, Cryotherapy, Moist heat, Traction, and Manual therapy   PLAN FOR NEXT SESSION: Continue with use of traction and symptom modulation strategies as needed, STM. DN at future session as needed. Peri-scapular strengthening/scap stability and postural re-edu for ADLs and work.       Consuela Mimes, PT, DPT  #[id number]  Gertie Exon, PT 10/23/2022, 4:02 PM

## 2022-10-24 ENCOUNTER — Encounter: Payer: Self-pay | Admitting: Physical Therapy

## 2022-10-28 ENCOUNTER — Ambulatory Visit: Payer: 59

## 2022-10-28 DIAGNOSIS — M542 Cervicalgia: Secondary | ICD-10-CM

## 2022-10-28 DIAGNOSIS — G8929 Other chronic pain: Secondary | ICD-10-CM

## 2022-10-28 DIAGNOSIS — M6281 Muscle weakness (generalized): Secondary | ICD-10-CM

## 2022-10-28 NOTE — Therapy (Addendum)
OUTPATIENT PHYSICAL THERAPY TREATMENT NOTE   Patient Name: Victoria Elliott MRN: [id number] DOB:05-13-1986, 38 y.o., female Today's Date: 10/28/2022  PCP: [facility], [place] REFERRING PROVIDER: Santiago Glad, MD   END OF SESSION:   PT End of Session - 10/28/22 1743     Visit Number 6    Number of Visits 13    Date for PT Re-Evaluation 10/22/22    Authorization Type Medicaid [employer]    Authorization Time Period Auth not req'd    Progress Note Due on Visit 10    PT Start Time 1651    PT Stop Time 1731    PT Time Calculation (min) 40 min    Activity Tolerance Patient tolerated treatment well    Behavior During Therapy [facility] for tasks assessed/performed                 Past Medical History:  Diagnosis Date   Gall stones    Past Surgical History:  Procedure Laterality Date   CESAREAN SECTION  04/22/2013   CESAREAN SECTION  12/17/2020   Procedure: CESAREAN SECTION;  Surgeon: Schermerhorn, Ihor Austin, MD;  Location: [facility] ORS;  Service: Obstetrics;;   DILATION AND CURETTAGE OF UTERUS     DILATION AND EVACUATION N/A 09/25/2017   Procedure: DILATATION AND EVACUATION;  Surgeon: Christeen Douglas, MD;  Location: [facility] ORS;  Service: Gynecology;  Laterality: N/A;   ESOPHAGOGASTRODUODENOSCOPY (EGD) WITH PROPOFOL N/A 07/31/2021   Procedure: ESOPHAGOGASTRODUODENOSCOPY (EGD) WITH PROPOFOL;  Surgeon: Toney Reil, MD;  Location: [facility] ENDOSCOPY;  Service: Gastroenterology;  Laterality: N/A;   Patient Active Problem List   Diagnosis Date Noted   Abdominal pain, chronic, epigastric    Antral erythema    Vaginal bleeding 04/22/2021   Gestational hypertension 12/18/2020   Prediabetes 12/18/2020   Uterine size date discrepancy, third trimester 12/18/2020   Previous cesarean delivery affecting pregnancy 12/17/2020   Postoperative state 12/17/2020   Lower back pain 12/05/2020   Gastritis 08/29/2020   Esophageal reflux 03/29/2014    REFERRING DIAG:  N62  (ICD-10-CM) - Breast hypertrophy  M54.2,M25.519 (ICD-10-CM) - Neck and shoulder pain    THERAPY DIAG:  Cervicalgia  Chronic left shoulder pain  Muscle weakness (generalized)  Rationale for Evaluation and Treatment Rehabilitation  PERTINENT HISTORY: Pt is a 37 year old female considering breast reduction surgery with primary complaint of L>R periscapular/paracervical pain. Pt reports worsening pain in shoulders/traps and into paracervical region following having her second child. She reports losing circulation into her hands. Patient reports pain into shoulders and back. She reports OTC medications do not help. Patient reports some tingling into her hands intermittently. She reports difficulty with moving her shoulder at times. Pt reports pain along L periscapular region mainly recently, though she has experienced pain on R and L. Patient reports disturbed sleep and waking up in middle of night. She states that position change can help sometimes. Pt denies bowel/bladder changes. No sudden weight change. Pt manages depression and anxiety with medications.    Pain:  Pain Intensity: Present: 8-9/10, Best: 1/10, Worst: 10/10 Pain location: L>R periscapular region, lower cervical Pain Quality: aching  Radiating: No  Numbness/Tingling: Yes Focal Weakness: No Aggravating factors: sometimes pain without specific trigger, leaning onto L arm Relieving factors: essential oil topical treatment 24-hour pain behavior: None History of prior neck injury, pain, surgery, or therapy: No Falls: Has patient fallen in last 6 months? 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  Hobbies: travel, outdoor activities  Red flags (personal history of cancer, h/o spinal tumors, history of compression fracture, chills/fever, night sweats, nausea, vomiting, unrelenting pain): Negative    Weight Bearing Restrictions: No   Living Environment Lives with: lives with their son and lives with their daughter Lives in: House/apartment     Patient Goals: Pt is coming here prior to considering breast reduction surgery     PRECAUTIONS: None    SUBJECTIVE:                                                                                                                                                                                      SUBJECTIVE STATEMENT:  Patient reports having increase in shoulder/neck pain following busy weekend and being active with her children. She reports pain affecting posterior cervical paraspinal region and L upper trapezius. She feels she did okay with last PT visit, but she did have aforementioned issues toward end of that week.    PAIN:  Are you having pain? 5/10 pain affecting L upper trapezius, posterior cervical paraspinal region  Pain location: L UT   OBJECTIVE: (objective measures completed at initial evaluation unless otherwise dated)   Patient Surveys  FOTO 50, predicted outcome score of 60   Cognition Patient is oriented to person, place, and time.  Recent memory is intact.  Remote memory is intact.  Attention span and concentration are intact.  Expressive speech is intact.  Patient's fund of knowledge is within normal limits for educational level.                          Gross Musculoskeletal Assessment Tremor: None Bulk: Normal Tone: Normal     Posture Rounded shoulders posture at rest, anteriorly tilted scapulae     AROM   Shoulder AROM Flexion WNL, pain at end-range Abduction WNL, pain in mid-range   AROM (Normal range in degrees) AROM 09/10/2022  Cervical  Flexion (50) 44*  Extension (80) 52* at end-range  Right lateral flexion (45) 42  Left lateral flexion (45) 45  Right rotation (85) 72  Left rotation (85) 71*  (* = pain; Blank rows = not tested)     MMT MMT (out of 5) Right 09/10/2022  Left 09/10/2022         Shoulder   Flexion      Extension 5 4  Abduction 5 4*  Internal rotation      Horizontal abduction      Horizontal adduction      Lower Trapezius      Rhomboids  Elbow  Flexion 5 5  Extension 5 5  Pronation      Supination             Wrist  Flexion 5 5  Extension 5 5  Radial deviation      Ulnar deviation      Finger ABD          WNL          WNL  (* = pain; Blank rows = not tested)   Sensation Grossly intact to light touch bilateral UE as determined by testing dermatomes C2-T2. Proprioception and hot/cold testing deferred on this date.   Reflexes R/L Elbow: 2+/2+  Brachioradialis: 2+/2+  Tricep: 2+/2+   Palpation Location LEFT  RIGHT           Suboccipitals      Cervical paraspinals 0 0  Upper Trapezius 1 0  Levator Scapulae 2 0  Rhomboid Major/Minor 2 0  (Blank rows = not tested) Graded on 0-4 scale (0 = no pain, 1 = pain, 2 = pain with wincing/grimacing/flinching, 3 = pain with withdrawal, 4 = unwilling to allow palpation), (Blank rows = not tested)     Repeated Movements Deferred today    Passive Accessory Intervertebral Motion Deferred     SPECIAL TESTS Spurlings A (ipsilateral lateral flexion/axial compression): R: Negative L: Negative Distraction Test: Positive  Hoffman Sign (cervical cord compression): R: Negative L: Negative ULTT Median: R: Not done L: Negative  ULTT Ulnar: R: Not done L: Negative  ULTT Radial: R: Not done L: Negative    TOS Adon Test: Negative  Roo's Test: Negative  Wright Test: Negative      TODAY'S TREATMENT    Therapeutic Exercises:    UBE 3/3 Back/Forw with postural education.  Prone Chin tucks 1 min   Prone Neck ext 1 min   Prone Scapular retraction #3 1 min Alternate and contralateral Arm and Leg ext in Quadruped W to Y with #1 1 min   Standing Shoulder shrugs #4 2 x 30 secs  Standing Shldr Flex #3 2 x 30 secs Standing Shldr Abd #3 2 x 30 secs T/S spine mobs to  improve alignment and decrease pain.  Pt received educated about Posture and core engaging activities.          Not Performed:  Manual Therapy - for symptom modulation, soft tissue sensitivity and mobility, joint mobility, ROM    Manual cervical traction; 10 sec on, 10 sec off; x 8 minutes  -minimal paracervical/periscapular pain during use of traction; we reviewed use of manual self-traction during this intervention  STM L>R upper trapezius, C3-C6 splenius cervicis/capitis; x 15 minutes    *not today* DTM and IASTM with Hypervolt L>R middle trapezius/rhomboid mm, L>R longissimus thoracis at T3-T7 levels   Not performed:  Trigger Point Dry Needling (TDN), unbilled Education performed with patient regarding potential benefit of TDN. Reviewed precautions and risks with patient. Reviewed special precautions/risks over lung fields which include pneumothorax. Reviewed signs and symptoms of pneumothorax and advised pt to go to ER immediately if these symptoms develop advise them of dry needling treatment. Extensive time spent with pt to ensure full understanding of TDN risks. Pt provided verbal consent to treatment. TDN performed to R and L splenius cervicis/capitis at C4-5 levels and C6-7 levels, bilateral upper trapezius with 0.25 x 40 single needle placements with local twitch response (LTR). Pistoning technique utilized. Moderate post-needling soreness noted today.      Therapeutic Exercise -  for improved soft tissue flexibility and extensibility as needed for ROM, postural re-edu and periscapular strengthening to improve tolerance of sustained upright activity and clinical work  Upper body ergometer, 2 minutes forward, 2 minutes backward - for tissue warm-up to improve muscle performance, improved soft tissue mobility/extensibility - 2 min unbilled   Cat Camel; 1x10, quadruped Prone W; 2x10, 3 sec hold   -tactile cueing to avoid scapular elevaiton Wall angel; 2x10, with  maintenance of cervical retraction  Nautilus; standing row; 40 lbs with bilat UE (2 single handles); 2x10   PATIENT EDUCATION: Tactile and verbal cueing as well as demo for exercise technique.    *not today* Bird dog; 2x10 alternating R/L  Prone T; 2x10, 3 sec hold  Column L (horizontal abduction with shoulders and elbows at 90 deg flexion); 2x10, 3 sec hold Doorway pec stretch; initiated in 90/90 position; performed x 30 sec  -modified to 60 deg abduction in doorway to improve tolerance due to shoulder discomfort; 3x30sec Standing row with Black Tband; 2x10, 3 sec hold  Upper trapezius stretch, reviewed Levator scapulae stretch, reviewed      PATIENT EDUCATION:  Education details: Plan of care Person educated: Patient Education method: Explanation Education comprehension: verbalized understanding     HOME EXERCISE PROGRAM: Access Code: [email or web address] URL: [email or web address] Date: 10/13/2022 Prepared by: Consuela Mimes  Exercises - Seated Self Cervical Traction  - 2 x daily - 7 x weekly - 1-2 sets - 10 reps - 10sec hold - Seated Levator Scapulae Stretch  - 2 x daily - 7 x weekly - 3 sets - 30sec hold - Corner Pec Minor Stretch  - 2 x daily - 7 x weekly - 3 sets - 30sec hold - Prone Scapular Retraction Arms at Side  - 1 x daily - 7 x weekly - 2 sets - 10 reps - 3sec hold - Standing Shoulder Row with Anchored Resistance  - 1 x daily - 7 x weekly - 2 sets - 10 reps - 3sec hold     ASSESSMENT:   CLINICAL IMPRESSION: Patient is very pleasant and participates well with physical therapy.Pt's Presented with nodules behind ear and on the UT since the last TDN and without symptoms.  Pt challenged with strengthening exs and postural education to decrease pain. PT found compensation rotation and retro displacement due to poor posture and weak muscles at L8/T9/T10. Pt benefited from Jt mobs at these level which allowed pt to perform T/S and BUE muscle strengthening without  discomfort.  Patient will benefit from continued skilled therapeutic intervention to address the above deficits as needed for improved function and QoL.     REHAB POTENTIAL: Good   CLINICAL DECISION MAKING: Evolving/moderate complexity   EVALUATION COMPLEXITY: Moderate     GOALS: Goals reviewed with patient? Yes   SHORT TERM GOALS: Target date: 10/01/2022   Pt will be independent with HEP to improve strength and decrease neck pain to improve pain-free function at home and work. Baseline: 09/10/22: Baseline HEP initiated  Goal status: INITIAL     LONG TERM GOALS: Target date: 10/22/2022   Pt will increase FOTO to at least 60 to demonstrate significant improvement in function at home and work related to neck pain  Baseline: 09/10/22: 50 Goal status: INITIAL   2.  Pt will decrease worst neck pain by at least 2 points on the NPRS in order to demonstrate clinically significant reduction in neck pain. Baseline: 09/10/22: 10/10 at worst  Goal status: INITIAL   3.  Pt will demonstrate normal cervical spine AROM without reproduction of L-sided neck or periscapular pain as needed for scanning environment, overhead activity, and driving Baseline: 10/21/79: Moderate motion loss with extension and bilateral rotation; pain with flexion and L rotation.  Goal status: INITIAL   4.  Patient will sleep through the night without disturbance secondary to neck pain  Baseline: 09/10/22: Pt reports frequent disturbed sleep and difficulty getting to sleep  Goal status: INITIAL     PLAN: PT FREQUENCY: 2x/week   PT DURATION: 4-6 weeks   PLANNED INTERVENTIONS: Therapeutic exercises, Therapeutic activity, Neuromuscular re-education, Patient/Family education, Joint manipulation, Joint mobilization, Dry Needling, Electrical stimulation, Spinal manipulation, Spinal mobilization, Cryotherapy, Moist heat, Traction, and Manual therapy   PLAN FOR NEXT SESSION: Continue with use of traction and symptom modulation  strategies as needed, STM. DN at future session as needed. Peri-scapular strengthening/scap stability and postural re-edu for ADLs and work.         Janet Berlin PT DPT 5:47 PM,10/28/22    [facility] Physical [facility] [address] [place], [place], [address] Phone: [phone]   Fax:  [phone]  Patient Details  Name: Victoria Elliott MRN: [id number] Date of Birth: 1985/07/31 Referring Provider:  Santiago Glad, MD  Encounter Date: 10/28/2022   Collie Siad, PT 10/28/2022, 5:45 PM  [facility] Physical Therapy [address] [place], [place], [address] Phone: [phone]   Fax:  [phone]

## 2022-10-30 ENCOUNTER — Ambulatory Visit: Payer: 59 | Admitting: Physical Therapy

## 2022-11-04 ENCOUNTER — Ambulatory Visit: Payer: 59 | Admitting: Physical Therapy

## 2022-11-06 ENCOUNTER — Ambulatory Visit: Payer: 59 | Admitting: Physical Therapy

## 2022-11-11 ENCOUNTER — Ambulatory Visit: Payer: 59 | Admitting: Physical Therapy

## 2022-11-11 ENCOUNTER — Encounter: Payer: Self-pay | Admitting: Physical Therapy

## 2022-11-11 DIAGNOSIS — M6281 Muscle weakness (generalized): Secondary | ICD-10-CM

## 2022-11-11 DIAGNOSIS — M542 Cervicalgia: Secondary | ICD-10-CM

## 2022-11-11 DIAGNOSIS — G8929 Other chronic pain: Secondary | ICD-10-CM

## 2022-11-11 NOTE — Therapy (Unsigned)
OUTPATIENT PHYSICAL THERAPY TREATMENT NOTE   Patient Name: Victoria Elliott MRN: [id number] DOB:Oct 05, 1985, 37 y.o., female Today's Date: 11/11/2022  PCP: [facility], [place] REFERRING PROVIDER: Santiago Glad, MD   END OF SESSION:   PT End of Session - 11/11/22 1647     Visit Number 7    Number of Visits 13    Date for PT Re-Evaluation 10/22/22    Authorization Type Medicaid [employer]    Authorization Time Period Auth not req'd    Progress Note Due on Visit 10    PT Start Time 1646    PT Stop Time 1726    PT Time Calculation (min) 40 min    Activity Tolerance Patient tolerated treatment well    Behavior During Therapy [facility] for tasks assessed/performed                  Past Medical History:  Diagnosis Date   Gall stones    Past Surgical History:  Procedure Laterality Date   CESAREAN SECTION  04/22/2013   CESAREAN SECTION  12/17/2020   Procedure: CESAREAN SECTION;  Surgeon: Schermerhorn, Ihor Austin, MD;  Location: [facility] ORS;  Service: Obstetrics;;   DILATION AND CURETTAGE OF UTERUS     DILATION AND EVACUATION N/A 09/25/2017   Procedure: DILATATION AND EVACUATION;  Surgeon: Christeen Douglas, MD;  Location: [facility] ORS;  Service: Gynecology;  Laterality: N/A;   ESOPHAGOGASTRODUODENOSCOPY (EGD) WITH PROPOFOL N/A 07/31/2021   Procedure: ESOPHAGOGASTRODUODENOSCOPY (EGD) WITH PROPOFOL;  Surgeon: Toney Reil, MD;  Location: [facility] END[facility]COPY;  Service: Gastroenterology;  Laterality: N/A;   Patient Active Problem List   Diagnosis Date Noted   Abdominal pain, chronic, epigastric    Antral erythema    Vaginal bleeding 04/22/2021   Gestational hypertension 12/18/2020   Prediabetes 12/18/2020   Uterine size date discrepancy, third trimester 12/18/2020   Previous cesarean delivery affecting pregnancy 12/17/2020   Postoperative state 12/17/2020   Lower back pain 12/05/2020   Gastritis 08/29/2020   Esophageal reflux 03/29/2014    REFERRING DIAG:  N62  (ICD-10-CM) - Breast hypertrophy  M54.2,M25.519 (ICD-10-CM) - Neck and shoulder pain    THERAPY DIAG:  Cervicalgia  Chronic left shoulder pain  Muscle weakness (generalized)  Rationale for Evaluation and Treatment Rehabilitation  PERTINENT HISTORY: Pt is a 37 year old female considering breast reduction surgery with primary complaint of L>R periscapular/paracervical pain. Pt reports worsening pain in shoulders/traps and into paracervical region following having her second child. She reports losing circulation into her hands. Patient reports pain into shoulders and back. She reports OTC medications do not help. Patient reports some tingling into her hands intermittently. She reports difficulty with moving her shoulder at times. Pt reports pain along L periscapular region mainly recently, though she has experienced pain on R and L. Patient reports disturbed sleep and waking up in middle of night. She states that position change can help sometimes. Pt denies bowel/bladder changes. No sudden weight change. Pt manages depression and anxiety with medications.    Pain:  Pain Intensity: Present: 8-9/10, Best: 1/10, Worst: 10/10 Pain location: L>R periscapular region, lower cervical Pain Quality: aching  Radiating: No  Numbness/Tingling: Yes Focal Weakness: No Aggravating factors: sometimes pain without specific trigger, leaning onto L arm Relieving factors: essential oil topical treatment 24-hour pain behavior: None History of prior neck injury, pain, surgery, or therapy: No Falls: Has patient fallen in last 6 months? 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  Hobbies: travel, outdoor activities  Red flags (personal history of cancer, h/o spinal tumors, history of compression fracture, chills/fever, night sweats, nausea, vomiting, unrelenting pain): Negative    Weight Bearing Restrictions: No   Living Environment Lives with: lives with their son and lives with their daughter Lives in: House/apartment     Patient Goals: Pt is coming here prior to considering breast reduction surgery     PRECAUTIONS: None    SUBJECTIVE:                                                                                                                                                                                      SUBJECTIVE STATEMENT:  Patient reports no pain at arrival. She reports tolerating additional exercises well last visit with prn therapist. She reports doing well following last DN treatment. Patient reports no recent flare-ups of periscapular pain or L shoulder pain.    PAIN:  Are you having pain? 5/10 pain affecting L upper trapezius, posterior cervical paraspinal region  Pain location: L UT   OBJECTIVE: (objective measures completed at initial evaluation unless otherwise dated)   Patient Surveys  FOTO 50, predicted outcome score of 60   Cognition Patient is oriented to person, place, and time.  Recent memory is intact.  Remote memory is intact.  Attention span and concentration are intact.  Expressive speech is intact.  Patient's fund of knowledge is within normal limits for educational level.                          Gross Musculoskeletal Assessment Tremor: None Bulk: Normal Tone: Normal     Posture Rounded shoulders posture at rest, anteriorly tilted scapulae     AROM   Shoulder AROM Flexion WNL, pain at end-range Abduction WNL, pain in mid-range  11/11/22 Flexion WNL, pain at end-range Abduction WNL, pain in end-range Functional ER: R T5, L T3 Functional IR: R T7, L T7*    AROM (Normal range in degrees) AROM 09/10/2022 AROM 11/11/22  Cervical   Flexion (50) 44* 47  Extension (80) 52* at end-range 57  Right lateral flexion (45) 42 50  Left lateral flexion (45) 45 44  Right rotation (85) 72 85  Left rotation  (85) 71* 75  (* = pain; Blank rows = not tested)     MMT MMT (out of 5) Right 09/10/2022 Left 09/10/2022 Right 11/11/22 Left 11/11/22           Shoulder     Flexion  5 4  5  4+  Extension  Abduction 5 4* 5 5  Internal rotation        Horizontal abduction        Horizontal adduction        Lower Trapezius        Rhomboids                 Elbow    Flexion 5 5 5 5   Extension 5 5 5 5   Pronation        Supination                 Wrist    Flexion 5 5    Extension 5 5    Radial deviation        Ulnar deviation        Finger ABD          WNL          WNL    (* = pain; Blank rows = not tested)   Sensation Grossly intact to light touch bilateral UE as determined by testing dermatomes C2-T2. Proprioception and hot/cold testing deferred on this date.   Reflexes R/L Elbow: 2+/2+  Brachioradialis: 2+/2+  Tricep: 2+/2+   Palpation Location LEFT  RIGHT           Suboccipitals      Cervical paraspinals 0 0  Upper Trapezius 1 0  Levator Scapulae 2 0  Rhomboid Major/Minor 2 0  (Blank rows = not tested) Graded on 0-4 scale (0 = no pain, 1 = pain, 2 = pain with wincing/grimacing/flinching, 3 = pain with withdrawal, 4 = unwilling to allow palpation), (Blank rows = not tested)     Repeated Movements Deferred today    Passive Accessory Intervertebral Motion Deferred     SPECIAL TESTS Spurlings A (ipsilateral lateral flexion/axial compression): R: Negative L: Negative Distraction Test: Positive  Hoffman Sign (cervical cord compression): R: Negative L: Negative ULTT Median: R: Not done L: Negative  ULTT Ulnar: R: Not done L: Negative  ULTT Radial: R: Not done L: Negative    TOS Adon Test: Negative  Roo's Test: Negative  Wright Test: Negative      TODAY'S TREATMENT     Manual Therapy - for symptom modulation, soft tissue sensitivity and mobility, joint mobility, ROM    *not today* Manual cervical traction; 10 sec on, 10 sec off; x 8 minutes  -minimal  paracervical/periscapular pain during use of traction; we reviewed use of manual self-traction during this intervention STM L>R upper trapezius, C3-C6 splenius cervicis/capitis; x 15 minutes DTM and IASTM with Hypervolt L>R middle trapezius/rhomboid mm, L>R longissimus thoracis at T3-T7 levels Trigger Point Dry Needling (TDN), unbilled Education performed with patient regarding potential benefit of TDN. Reviewed precautions and risks with patient. Reviewed special precautions/risks over lung fields which include pneumothorax. Reviewed signs and symptoms of pneumothorax and advised pt to go to ER immediately if these symptoms develop advise them of dry needling treatment. Extensive time spent with pt to ensure full understanding of TDN risks. Pt provided verbal consent to treatment. TDN performed to R and L splenius cervicis/capitis at C4-5 levels and C6-7 levels, bilateral upper trapezius with 0.25 x 40 single needle placements with local twitch response (LTR). Pistoning technique utilized. Moderate post-needling soreness noted today.      Therapeutic Exercise - for improved soft tissue flexibility and extensibility as needed for ROM, postural re-edu and periscapular strengthening to improve tolerance of sustained upright activity and clinical work   *GOAL UPDATE PERFORMED  Upper body ergometer, 2 minutes forward, 2 minutes backward - for tissue warm-up to improve muscle performance, improved soft tissue mobility/extensibility - 2 min unbilled   Prone W; 2x10, 3 sec hold   -tactile cueing to avoid scapular elevaiton Prone Y; 2x10, 3 sec hold Wall angel; 2x10, with maintenance of cervical retraction  Nautilus; standing row; 50 lbs with bilat UE (2 single handles); 2x12  Alternating shoulder taps; 2x10 alternating   PATIENT EDUCATION: Tactile and verbal cueing as well as demo for exercise technique.    *not today* Cat Camel; 1x10, quadruped Bird dog; 2x10 alternating R/L  Prone T; 2x10,  3 sec hold  Column L (horizontal abduction with shoulders and elbows at 90 deg flexion); 2x10, 3 sec hold Doorway pec stretch; initiated in 90/90 position; performed x 30 sec  -modified to 60 deg abduction in doorway to improve tolerance due to shoulder discomfort; 3x30sec Standing row with Black Tband; 2x10, 3 sec hold  Upper trapezius stretch, reviewed Levator scapulae stretch, reviewed      PATIENT EDUCATION:  Education details: Plan of care Person educated: Patient Education method: Explanation Education comprehension: verbalized understanding     HOME EXERCISE PROGRAM: Access Code: [email or web address] URL: [email or web address] Date: 10/13/2022 Prepared by: Consuela Mimes  Exercises - Seated Self Cervical Traction  - 2 x daily - 7 x weekly - 1-2 sets - 10 reps - 10sec hold - Seated Levator Scapulae Stretch  - 2 x daily - 7 x weekly - 3 sets - 30sec hold - Corner Pec Minor Stretch  - 2 x daily - 7 x weekly - 3 sets - 30sec hold - Prone Scapular Retraction Arms at Side  - 1 x daily - 7 x weekly - 2 sets - 10 reps - 3sec hold - Standing Shoulder Row with Anchored Resistance  - 1 x daily - 7 x weekly - 2 sets - 10 reps - 3sec hold     ASSESSMENT:   CLINICAL IMPRESSION: Patient . Patient will benefit from continued skilled therapeutic intervention to address the above deficits as needed for improved function and QoL.     REHAB POTENTIAL: Good   CLINICAL DECISION MAKING: Evolving/moderate complexity   EVALUATION COMPLEXITY: Moderate     GOALS: Goals reviewed with patient? Yes   SHORT TERM GOALS: Target date: 10/01/2022   Pt will be independent with HEP to improve strength and decrease neck pain to improve pain-free function at home and work. Baseline: 09/10/22: Baseline HEP initiated.   11/11/22: Pt reports some intermittent non-compliance with HEP.  Goal status: ON-GOING     LONG TERM GOALS: Target date: 10/22/2022   Pt will increase FOTO to at least 60 to  demonstrate significant improvement in function at home and work related to neck pain  Baseline: 09/10/22: 50.  11/11/22: 66/60 Goal status: ACHIEVED    2.  Pt will decrease worst neck pain by at least 2 points on the NPRS in order to demonstrate clinically significant reduction in neck pain. Baseline: 09/10/22: 10/10 at worst.      11/11/22: No pain last two weeks Goal status: ACHIEVED    3.  Pt will demonstrate normal cervical spine AROM without reproduction of L-sided neck or periscapular pain as needed for scanning environment, overhead activity, and driving Baseline: 5/40/98: Moderate motion loss with extension and bilateral rotation; pain with flexion and L rotation.  11/11/22: Mild extension motion loss, minimal L rotation motion loss.  Goal status: ACHIEVED    4.  Patient will sleep  through the night without disturbance secondary to neck pain  Baseline: 09/10/22: Pt reports frequent disturbed sleep and difficulty getting to sleep.  11/11/22: No recent disturbed sleep from upper quarter.  Goal status: ACHIEVED     PLAN: PT FREQUENCY: 2x/week   PT DURATION: 4-6 weeks   PLANNED INTERVENTIONS: Therapeutic exercises, Therapeutic activity, Neuromuscular re-education, Patient/Family education, Joint manipulation, Joint mobilization, Dry Needling, Electrical stimulation, Spinal manipulation, Spinal mobilization, Cryotherapy, Moist heat, Traction, and Manual therapy   PLAN FOR NEXT SESSION: Continue with use of traction and symptom modulation strategies as needed, STM. DN at future session as needed. Peri-scapular strengthening/scap stability and postural re-edu for ADLs and work.         Consuela Mimes, PT, DPT [phone]  5:25 PM,11/11/22   [facility] [address] [place], [place], [address] Phone: [phone]   Fax:  [phone]

## 2022-11-13 ENCOUNTER — Encounter: Payer: Self-pay | Admitting: Physical Therapy

## 2022-11-13 ENCOUNTER — Ambulatory Visit: Payer: 59 | Attending: Plastic Surgery | Admitting: Physical Therapy

## 2022-11-13 DIAGNOSIS — M25512 Pain in left shoulder: Secondary | ICD-10-CM | POA: Insufficient documentation

## 2022-11-13 DIAGNOSIS — M542 Cervicalgia: Secondary | ICD-10-CM | POA: Diagnosis present

## 2022-11-13 DIAGNOSIS — M6281 Muscle weakness (generalized): Secondary | ICD-10-CM

## 2022-11-13 DIAGNOSIS — G8929 Other chronic pain: Secondary | ICD-10-CM

## 2022-11-13 NOTE — Therapy (Signed)
OUTPATIENT PHYSICAL THERAPY TREATMENT NOTE AND DISCHARGE SUMMARY   Patient Name: Victoria Elliott MRN: [id number] DOB:1986/02/12, 37 y.o., female Today's Date: 11/13/2022  PCP: [facility], [place] REFERRING PROVIDER: Santiago Glad, MD   END OF SESSION:   PT End of Session - 11/13/22 1653     Visit Number 8    Number of Visits 13    Date for PT Re-Evaluation 10/22/22    Authorization Type Medicaid [employer]    Authorization Time Period Auth not req'd    Progress Note Due on Visit 10    PT Start Time 1648    PT Stop Time 1727    PT Time Calculation (min) 39 min    Activity Tolerance Patient tolerated treatment well    Behavior During Therapy [facility] for tasks assessed/performed             Past Medical History:  Diagnosis Date   Gall stones    Past Surgical History:  Procedure Laterality Date   CESAREAN SECTION  04/22/2013   CESAREAN SECTION  12/17/2020   Procedure: CESAREAN SECTION;  Surgeon: Schermerhorn, Ihor Austin, MD;  Location: [facility] ORS;  Service: Obstetrics;;   DILATION AND CURETTAGE OF UTERUS     DILATION AND EVACUATION N/A 09/25/2017   Procedure: DILATATION AND EVACUATION;  Surgeon: Christeen Douglas, MD;  Location: [facility] ORS;  Service: Gynecology;  Laterality: N/A;   ESOPHAGOGASTRODUODENOSCOPY (EGD) WITH PROPOFOL N/A 07/31/2021   Procedure: ESOPHAGOGASTRODUODENOSCOPY (EGD) WITH PROPOFOL;  Surgeon: Toney Reil, MD;  Location: [facility] ENDOSCOPY;  Service: Gastroenterology;  Laterality: N/A;   Patient Active Problem List   Diagnosis Date Noted   Abdominal pain, chronic, epigastric    Antral erythema    Vaginal bleeding 04/22/2021   Gestational hypertension 12/18/2020   Prediabetes 12/18/2020   Uterine size date discrepancy, third trimester 12/18/2020   Previous cesarean delivery affecting pregnancy 12/17/2020   Postoperative state 12/17/2020   Lower back pain 12/05/2020   Gastritis 08/29/2020   Esophageal reflux 03/29/2014    REFERRING DIAG:   N62 (ICD-10-CM) - Breast hypertrophy  M54.2,M25.519 (ICD-10-CM) - Neck and shoulder pain    THERAPY DIAG:  Cervicalgia  Chronic left shoulder pain  Muscle weakness (generalized)  Rationale for Evaluation and Treatment Rehabilitation  PERTINENT HISTORY: Pt is a 37 year old female considering breast reduction surgery with primary complaint of L>R periscapular/paracervical pain. Pt reports worsening pain in shoulders/traps and into paracervical region following having her second child. She reports losing circulation into her hands. Patient reports pain into shoulders and back. She reports OTC medications do not help. Patient reports some tingling into her hands intermittently. She reports difficulty with moving her shoulder at times. Pt reports pain along L periscapular region mainly recently, though she has experienced pain on R and L. Patient reports disturbed sleep and waking up in middle of night. She states that position change can help sometimes. Pt denies bowel/bladder changes. No sudden weight change. Pt manages depression and anxiety with medications.    Pain:  Pain Intensity: Present: 8-9/10, Best: 1/10, Worst: 10/10 Pain location: L>R periscapular region, lower cervical Pain Quality: aching  Radiating: No  Numbness/Tingling: Yes Focal Weakness: No Aggravating factors: sometimes pain without specific trigger, leaning onto L arm Relieving factors: essential oil topical treatment 24-hour pain behavior: None History of prior neck injury, pain, surgery, or therapy: No Falls: Has patient fallen in last 6 months? 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  Hobbies: travel, outdoor activities  Red flags (personal history of cancer, h/o spinal tumors, history of compression fracture, chills/fever, night sweats, nausea, vomiting, unrelenting pain): Negative    Weight Bearing Restrictions: No   Living Environment Lives with: lives with their son and lives with their daughter Lives in: House/apartment     Patient Goals: Pt is coming here prior to considering breast reduction surgery     PRECAUTIONS: None    SUBJECTIVE:                                                                                                                                                                                      SUBJECTIVE STATEMENT:  Patient reports some back pain starting earlier and she felt that it could have flared up if she moved abruptly. She states she was able to "walk it off" and that it is not hurting significantly now. She feels that PT has helped and denies notable flare up over previous couple of weeks regarding cervicothoracic pain. She feels that she will likely move forward with her breast reduction surgery. Patient denies recent functional limitation and reports no recent sleep disturbance secondary to cervicothoracic pain.   PAIN:  Are you having pain? No Pain location: L UT   OBJECTIVE: (objective measures completed at initial evaluation unless otherwise dated)   Patient Surveys  FOTO 50, predicted outcome score of 60   Cognition Patient is oriented to person, place, and time.  Recent memory is intact.  Remote memory is intact.  Attention span and concentration are intact.  Expressive speech is intact.  Patient's fund of knowledge is within normal limits for educational level.                          Gross Musculoskeletal Assessment Tremor: None Bulk: Normal Tone: Normal     Posture Rounded shoulders posture at rest, anteriorly tilted scapulae     AROM   Shoulder AROM Flexion WNL, pain at end-range Abduction WNL, pain in mid-range  11/11/22 Flexion WNL, pain at end-range Abduction WNL, pain in end-range Functional ER: R T5, L T3 Functional IR: R T7, L T7*    AROM (Normal range in degrees) AROM 09/10/2022  AROM 11/11/22  Cervical   Flexion (50) 44* 47  Extension (80) 52* at end-range 57  Right lateral flexion (45) 42 50  Left lateral flexion (45) 45 44  Right rotation (85) 72 85  Left rotation (85) 71* 75  (* = pain; Blank rows = not tested)     MMT MMT (out of 5) Right 09/10/2022 Left  09/10/2022 Right 11/11/22 Left 11/11/22           Shoulder     Flexion  5 4  5  4+  Extension      Abduction 5 4* 5 5  Internal rotation        Horizontal abduction        Horizontal adduction        Lower Trapezius        Rhomboids                 Elbow    Flexion 5 5 5 5   Extension 5 5 5 5   Pronation        Supination                 Wrist    Flexion 5 5    Extension 5 5    Radial deviation        Ulnar deviation        Finger ABD          WNL          WNL    (* = pain; Blank rows = not tested)   Sensation Grossly intact to light touch bilateral UE as determined by testing dermatomes C2-T2. Proprioception and hot/cold testing deferred on this date.   Reflexes R/L Elbow: 2+/2+  Brachioradialis: 2+/2+  Tricep: 2+/2+   Palpation Location LEFT  RIGHT           Suboccipitals      Cervical paraspinals 0 0  Upper Trapezius 1 0  Levator Scapulae 2 0  Rhomboid Major/Minor 2 0  (Blank rows = not tested) Graded on 0-4 scale (0 = no pain, 1 = pain, 2 = pain with wincing/grimacing/flinching, 3 = pain with withdrawal, 4 = unwilling to allow palpation), (Blank rows = not tested)     Repeated Movements Deferred today    Passive Accessory Intervertebral Motion Deferred     SPECIAL TESTS Spurlings A (ipsilateral lateral flexion/axial compression): R: Negative L: Negative Distraction Test: Positive  Hoffman Sign (cervical cord compression): R: Negative L: Negative ULTT Median: R: Not done L: Negative  ULTT Ulnar: R: Not done L: Negative  ULTT Radial: R: Not done L: Negative    TOS Adon Test: Negative  Roo's Test: Negative  Wright Test: Negative      TODAY'S TREATMENT      Manual Therapy - for symptom modulation, soft tissue sensitivity and mobility, joint mobility, ROM    *not today* Manual cervical traction; 10 sec on, 10 sec off; x 8 minutes  -minimal paracervical/periscapular pain during use of traction; we reviewed use of manual self-traction during this intervention STM L>R upper trapezius, C3-C6 splenius cervicis/capitis; x 15 minutes DTM and IASTM with Hypervolt L>R middle trapezius/rhomboid mm, L>R longissimus thoracis at T3-T7 levels Trigger Point Dry Needling (TDN), unbilled Education performed with patient regarding potential benefit of TDN. Reviewed precautions and risks with patient. Reviewed special precautions/risks over lung fields which include pneumothorax. Reviewed signs and symptoms of pneumothorax and advised pt to go to ER immediately if these symptoms develop advise them of dry needling treatment. Extensive time spent with pt to ensure full understanding of TDN risks. Pt provided verbal consent to treatment. TDN performed to R and L splenius cervicis/capitis at C4-5 levels and C6-7 levels, bilateral upper trapezius with 0.25 x 40 single needle placements with local twitch response (LTR). Pistoning technique utilized. Moderate post-needling soreness noted today.      Therapeutic  Exercise - for improved soft tissue flexibility and extensibility as needed for ROM, postural re-edu and periscapular strengthening to improve tolerance of sustained upright activity and clinical work   *GOAL UPDATE PERFORMED   Upper body ergometer, 2 minutes forward, 2 minutes backward - for tissue warm-up to improve muscle performance, improved soft tissue mobility/extensibility - 2 min unbilled    Wall angel; 2x10, with maintenance of cervical retraction    Bird dog; 1x10 alternating   Prone Y; 2x10, 3 sec hold  Bruegger's; x10, 10 sec hold with Red Tband   Nautilus; standing row; 60 lbs with bilat UE (2 single handles); 2x10  Alternating shoulder  taps; 2x10 alternating   PATIENT EDUCATION: Tactile and verbal cueing as well as demo for exercise technique. HEP update and review. Discussed maintenance plan to continue at this time and to discuss with her plastic surgeon any potential surgical intervention needed.    *not today* Prone W; 2x10, 3 sec hold   -tactile cueing to avoid scapular elevaiton Cat Camel; 1x10, quadruped Bird dog; 2x10 alternating R/L  Prone T; 2x10, 3 sec hold  Column L (horizontal abduction with shoulders and elbows at 90 deg flexion); 2x10, 3 sec hold Doorway pec stretch; initiated in 90/90 position; performed x 30 sec  -modified to 60 deg abduction in doorway to improve tolerance due to shoulder discomfort; 3x30sec Standing row with Black Tband; 2x10, 3 sec hold  Upper trapezius stretch, reviewed Levator scapulae stretch, reviewed      PATIENT EDUCATION:  Education details: Plan of care Person educated: Patient Education method: Explanation Education comprehension: verbalized understanding     HOME EXERCISE PROGRAM: Access Code: [email or web address] URL: [email or web address] Date: 10/13/2022 Prepared by: Consuela Mimes  Exercises - Seated Self Cervical Traction  - 2 x daily - 7 x weekly - 1-2 sets - 10 reps - 10sec hold - Seated Levator Scapulae Stretch  - 2 x daily - 7 x weekly - 3 sets - 30sec hold - Corner Pec Minor Stretch  - 2 x daily - 7 x weekly - 3 sets - 30sec hold - Prone Scapular Retraction Arms at Side  - 1 x daily - 7 x weekly - 2 sets - 10 reps - 3sec hold - Standing Shoulder Row with Anchored Resistance  - 1 x daily - 7 x weekly - 2 sets - 10 reps - 3sec hold     ASSESSMENT:   CLINICAL IMPRESSION: Patient has met or mostly met her long-term PT goals and has not experienced recent sleep disturbance and has not had notable flare-up over last 2 weeks. Pt feels that PT has helped and she reports no major functional limitation at this time. Patient has participated well with PT  and has been able to significantly progress with postural re-ed/PRE. Given her current progress and robust HEP, pt is appropriate for continued HEP at this time. We will discharge following today's visit.     REHAB POTENTIAL: Good   CLINICAL DECISION MAKING: Evolving/moderate complexity   EVALUATION COMPLEXITY: Moderate     GOALS: Goals reviewed with patient? Yes   SHORT TERM GOALS: Target date: 10/01/2022   Pt will be independent with HEP to improve strength and decrease neck pain to improve pain-free function at home and work. Baseline: 09/10/22: Baseline HEP initiated.   11/11/22: Pt reports some intermittent non-compliance with HEP.  Goal status: ON-GOING     LONG TERM GOALS: Target date: 10/22/2022   Pt will increase FOTO to at least 60 to demonstrate  significant improvement in function at home and work related to neck pain  Baseline: 09/10/22: 50.  11/11/22: 66/60 Goal status: ACHIEVED    2.  Pt will decrease worst neck pain by at least 2 points on the NPRS in order to demonstrate clinically significant reduction in neck pain. Baseline: 09/10/22: 10/10 at worst.      11/11/22: No pain last two weeks Goal status: ACHIEVED    3.  Pt will demonstrate normal cervical spine AROM without reproduction of L-sided neck or periscapular pain as needed for scanning environment, overhead activity, and driving Baseline: 1/61/09: Moderate motion loss with extension and bilateral rotation; pain with flexion and L rotation.  11/11/22: Mild extension motion loss, minimal L rotation motion loss.  Goal status: IN PROGRESS/MOSTLY MET    4.  Patient will sleep through the night without disturbance secondary to neck pain  Baseline: 09/10/22: Pt reports frequent disturbed sleep and difficulty getting to sleep.  11/11/22: No recent disturbed sleep from upper quarter.  Goal status: ACHIEVED     PLAN: PT FREQUENCY: -   PT DURATION: -   PLANNED INTERVENTIONS: Therapeutic exercises, Therapeutic activity,  Neuromuscular re-education, Patient/Family education, Joint manipulation, Joint mobilization, Dry Needling, Electrical stimulation, Spinal manipulation, Spinal mobilization, Cryotherapy, Moist heat, Traction, and Manual therapy   PLAN FOR NEXT SESSION: Pt to continue with HEP and will discharge current case.        Consuela Mimes, PT, DPT [phone]  4:54 PM,11/13/22   [facility] Physical Therapy [address] [place], [place], [address] Phone: [phone]   Fax:  [phone]

## 2022-11-26 ENCOUNTER — Ambulatory Visit (INDEPENDENT_AMBULATORY_CARE_PROVIDER_SITE_OTHER): Payer: 59 | Admitting: Plastic Surgery

## 2022-11-26 VITALS — Ht 64.0 in | Wt 247.4 lb

## 2022-11-26 DIAGNOSIS — N62 Hypertrophy of breast: Secondary | ICD-10-CM | POA: Diagnosis not present

## 2022-11-26 NOTE — Progress Notes (Signed)
   Victoria Elliott returns today for evaluation for a breast reduction.  The patient has been deemed a good candidate for breast reduction however her weight is still a little on the high side.  She has been working with a weight loss management program and is scheduled to begin medical management for her excess weight.  I have encouraged her to continue on her weight loss plan.  In 3 months if she is having good progress then we will wait until she reaches her goal weight of 200 pounds before scheduling her for a breast reduction.  If her weight loss has plateaued we will schedule her for breast reduction at that time.  Follow-up 3 months.

## 2023-02-06 ENCOUNTER — Telehealth: Payer: Self-pay | Admitting: Plastic Surgery

## 2023-02-06 NOTE — Telephone Encounter (Signed)
Pt calls and wants to go through with surgery and wants to schedule date. Dr Ladona Ridgel advised to call back when ready

## 2023-02-10 NOTE — Telephone Encounter (Signed)
Called patient let her know we would restart her auth process and let her know the results when the insurance gets back to [place]

## 2023-02-16 ENCOUNTER — Telehealth: Payer: Self-pay | Admitting: *Deleted

## 2023-02-16 NOTE — Telephone Encounter (Signed)
Received a call from Victoria Elliott stating that [facility] facilities are not in network with her Victoria Elliott plan and that she has no out of network benefits.   Victoria Elliott is cancelling our auth request.   LVM for patient.

## 2023-02-26 ENCOUNTER — Ambulatory Visit: Payer: 59 | Admitting: Student
